# Patient Record
Sex: Male | Born: 1990
Health system: Southern US, Community
[De-identification: ages and names within clinical notes are randomized; demographics above are authoritative.]

## PROBLEM LIST (undated history)

## (undated) DIAGNOSIS — T7840XA Allergy, unspecified, initial encounter: Secondary | ICD-10-CM

## (undated) DIAGNOSIS — G43909 Migraine, unspecified, not intractable, without status migrainosus: Secondary | ICD-10-CM

## (undated) HISTORY — DX: Migraine, unspecified, not intractable, without status migrainosus: G43.909

## (undated) HISTORY — DX: Allergy, unspecified, initial encounter: T78.40XA

---
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
## 2013-03-25 HISTORY — PX: APPENDECTOMY: SHX54

## 2013-08-19 LAB — URINALYSIS, COMPLETE
BILIRUBIN, UR: NEGATIVE
BLOOD: NEGATIVE
Bacteria: NONE SEEN
GLUCOSE, UR: NEGATIVE mg/dL (ref 0–75)
Leukocyte Esterase: NEGATIVE
NITRITE: NEGATIVE
PH: 6 (ref 4.5–8.0)
PROTEIN: NEGATIVE
RBC, UR: NONE SEEN /HPF (ref 0–5)
SQUAMOUS EPITHELIAL: NONE SEEN
Specific Gravity: 1.021 (ref 1.003–1.030)
WBC UR: NONE SEEN /HPF (ref 0–5)

## 2013-08-19 LAB — CBC WITH DIFFERENTIAL/PLATELET
Basophil #: 0.1 10*3/uL (ref 0.0–0.1)
Basophil %: 0.3 %
EOS PCT: 0.7 %
Eosinophil #: 0.1 10*3/uL (ref 0.0–0.7)
HCT: 48.8 % (ref 40.0–52.0)
HGB: 15.8 g/dL (ref 13.0–18.0)
Lymphocyte #: 1.8 10*3/uL (ref 1.0–3.6)
Lymphocyte %: 9.3 %
MCH: 29.2 pg (ref 26.0–34.0)
MCHC: 32.4 g/dL (ref 32.0–36.0)
MCV: 90 fL (ref 80–100)
Monocyte #: 0.8 x10 3/mm (ref 0.2–1.0)
Monocyte %: 4.2 %
Neutrophil #: 16.2 10*3/uL — ABNORMAL HIGH (ref 1.4–6.5)
Neutrophil %: 85.5 %
Platelet: 268 10*3/uL (ref 150–440)
RBC: 5.43 10*6/uL (ref 4.40–5.90)
RDW: 13.6 % (ref 11.5–14.5)
WBC: 19 10*3/uL — ABNORMAL HIGH (ref 3.8–10.6)

## 2013-08-19 LAB — COMPREHENSIVE METABOLIC PANEL
ALK PHOS: 76 U/L
ANION GAP: 9 (ref 7–16)
Albumin: 4.5 g/dL (ref 3.4–5.0)
BUN: 12 mg/dL (ref 7–18)
Bilirubin,Total: 0.5 mg/dL (ref 0.2–1.0)
CREATININE: 1.17 mg/dL (ref 0.60–1.30)
Calcium, Total: 9.6 mg/dL (ref 8.5–10.1)
Chloride: 104 mmol/L (ref 98–107)
Co2: 28 mmol/L (ref 21–32)
EGFR (African American): >60
EGFR (Non-African Amer.): >60
Glucose: 100 mg/dL — ABNORMAL HIGH (ref 65–99)
Osmolality: 281 (ref 275–301)
Potassium: 3.9 mmol/L (ref 3.5–5.1)
SGOT(AST): 14 U/L — ABNORMAL LOW (ref 15–37)
SGPT (ALT): 13 U/L (ref 12–78)
Sodium: 141 mmol/L (ref 136–145)
Total Protein: 8.4 g/dL — ABNORMAL HIGH (ref 6.4–8.2)

## 2013-08-19 LAB — LIPASE, BLOOD: Lipase: 101 U/L (ref 73–393)

## 2013-08-20 ENCOUNTER — Ambulatory Visit: Payer: Self-pay | Admitting: Surgery

## 2013-08-23 LAB — PATHOLOGY REPORT

## 2013-10-09 ENCOUNTER — Ambulatory Visit: Payer: Self-pay | Admitting: Physician Assistant

## 2013-10-09 LAB — MONONUCLEOSIS SCREEN: MONO TEST: NEGATIVE

## 2013-10-09 LAB — RAPID STREP-A WITH REFLX: Micro Text Report: NEGATIVE

## 2013-10-12 LAB — BETA STREP CULTURE(ARMC)

## 2014-07-16 NOTE — H&P (Signed)
PATIENT NAME:  Mark Fox, [name] MR#:  [id number] DATE OF BIRTH:  01-Jul-1990  DATE OF ADMISSION:  08/19/2013  PRIMARY CARE PHYSICIAN: [facility].   ADMITTING PHYSICIAN: Dr. Michela PitcherEly.   CHIEF COMPLAINT: Abdominal pain, nausea, vomiting.   BRIEF HISTORY: Mark EddyZachary Fox is a 24 year old young man with an approximately 12 hour history of significant suprapubic, periumbilical abdominal pain. The pain was accompanied with nausea, vomiting. No fever but a questionable chill. He had no previous similar symptoms. He denies any other significant abdominal history. He has had no previous abdominal surgery. Workup in the Emergency Room revealed an elevated white blood cell count and CT scan demonstrated thickened appendix with evidence consistent with acute appendicitis. There did not appear to be evidence of rupture. The surgical service was consulted.   He denies any major medical problems. He does have history of migraines, currently treated by [facility] headache [facility]. He was originally cared for by [facility].   ADMISSION MEDICATIONS:, prochlorperazine 10 mg 3 times a day and Topamax 3 tablets once a day.   ALLERGIES: He has no medical allergies.   SOCIAL HISTORY: He not a cigarette smoker.   REVIEW OF SYSTEMS: Otherwise unremarkable with the exception of the symptoms noted above.   PHYSICAL EXAMINATION:  GENERAL: He is lying comfortably in bed, in moderate distress from pain.  VITALS: Blood pressure is 120/80. Heart rate is 56 and regular. He is afebrile.  HEENT: No scleral icterus. No pupillary abnormalities. No facial deformities.  NECK: Supple, nontender. No adenopathy. Midline trachea.  CHEST: Clear with no adventitious sounds and normal pulmonary excursion.  CARDIAC: No murmurs or gallops to my ear. He seems to be in normal sinus rhythm.  ABDOMEN: Generally soft. He does have some suprapubic bilateral lower quadrant tenderness. Point tenderness right lower  quadrant with mild guarding and rebound noted.  LOWER EXTREMITIES: Full range of motion. No deformities. Good distal pulses.  PSYCHIATRIC: Normal orientation. Normal affect.   IMPRESSION: I have independently reviewed his CT scan. He does have a thickened appendix along the right pelvic sidewall. He does not have any evidence of rupture. With this clinical situation, this elevated white blood cell count and his CT scan, I would suggest he likely has acute appendicitis. We will recommend surgical intervention. This plan has been discussed with the patient and his mother, and they are in agreement.   ____________________________ Carmie Endalph L. Ely III, MD rle:gb D: 08/19/2013 23:38:32 ET T: 08/20/2013 01:33:43 ET JOB#: [id number]  cc: Quentin Orealph L. Ely III, MD, <Dictator> Quentin OreALPH L ELY MD ELECTRONICALLY SIGNED 08/23/2013 9:51

## 2014-07-16 NOTE — Op Note (Signed)
PATIENT NAME:  Mark Fox, Mark Fox MR#:  [id number] DATE OF BIRTH:  11/02/1990  DATE OF PROCEDURE:  08/20/2013  PREOPERATIVE DIAGNOSIS: Acute appendicitis.   POSTOPERATIVE DIAGNOSIS: Acute appendicitis.   PROCEDURE PERFORMED: Laparoscopic appendectomy.   ANESTHESIA: General.   SURGEON: Quentin Orealph L. Ely, MD    OPERATIVE PROCEDURE: With the patient in the supine position after the induction of appropriate general anesthesia, the patient's abdomen was prepped with ChloraPrep and draped with sterile towels. Fox small supraumbilical incision was made in the standard fashion, carried down bluntly through the subcutaneous tissue. Fox Veress needle was used to cannulate the peritoneal cavity. CO2 was insufflated to appropriate pressure measurements. When approximately 2.5 liters of CO2 were instilled, Fox 5 mm port was inserted in the peritoneal cavity and the peritoneal position was confirmed. CO2 was reinsufflated.  Fox right lateral incision was made and Fox 5 mm port inserted under direct vision.  The cecum appeared to hang low in the pelvis, and examining it, there was Fox large, dilated, thickened, injected appendix though without evidence of rupture. Fox left lower quadrant transverse incision was made and Fox 12 mm port inserted under direct vision. Dissection was carried out through the 2 lower ports. The mesoappendix was identified and Fox window created behind the appendix.  The appendix was amputated at the base using the Endo GIA stapler carrying Fox blue load and then the mesoappendix was divided with Fox single application of the Endo GIA stapler carrying Fox white load. The appendix was captured in Endo Catch apparatus and removed without difficulty. The area was then copiously irrigated with Fox warm saline solution. The abdomen was suctioned. The left lower quadrant incision was closed with Fox figure-of-eight suture of 0 Vicryl using the Suture Passer.  The abdomen was then desufflated and all ports withdrawn without  difficulty.  Midline fascia was closed with figure-of-eight sutures, 0 Vicryl.  The skin was closed with 5-0 nylon.  The area was infiltrated with 0.25% Marcaine for postoperative pain control. Sterile dressings were applied. The patient was returned to the recovery room, having tolerated the procedure well. Sponge, instrument, and needle counts were correct x 2 in the operating room.    ____________________________ Quentin Orealph L. Ely III, MD rle:dd D: 08/20/2013 01:13:32 ET T: 08/20/2013 05:42:32 ET JOB#: [id number]  cc: Quentin Orealph L. Ely III, MD, <Dictator> Quentin OreALPH L ELY MD ELECTRONICALLY SIGNED 08/23/2013 9:51

## 2014-12-27 IMAGING — CT CT ABD-PELV W/ CM
2 of 4 series · 15 of 46 positions shown, 17 images · IV contrast (isovue)
Comparison: None.

CLINICAL DATA: Abdominal pain and vomiting.  Leukocytosis.

EXAM:
CT ABDOMEN AND PELVIS WITH CONTRAST
TECHNIQUE: Multidetector CT imaging of the abdomen and pelvis was performed
using the standard protocol following bolus administration of
intravenous contrast.
CONTRAST:  80 mL of Isovue 300 IV contrast

[Series 2: routine abd pel with · axial · 0.61mm/px · z∈[+434,+784]mm · 12 of 81 slices shown, 14 images]
[im 7/81  soft-tissue]
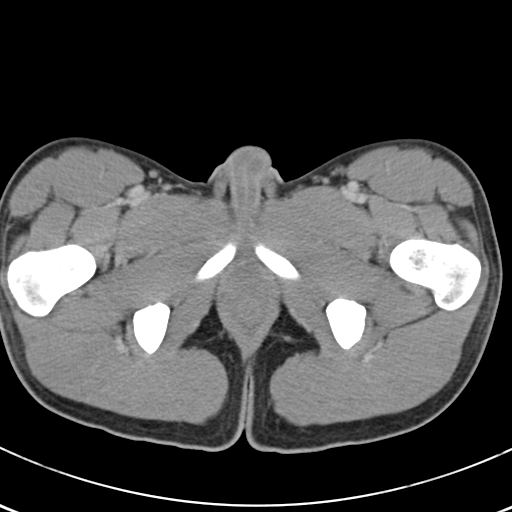
[im 7/81  bone]
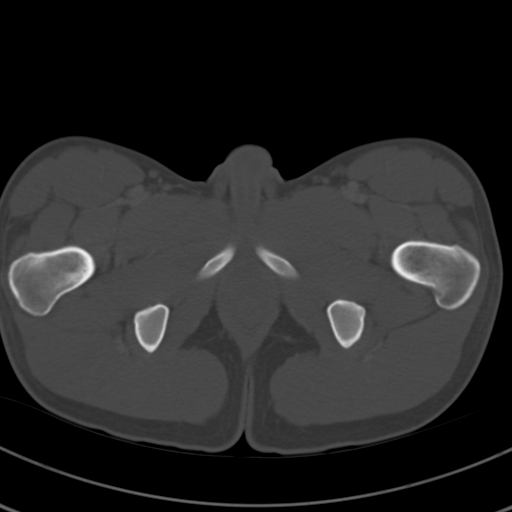
[im 13/81  soft-tissue]
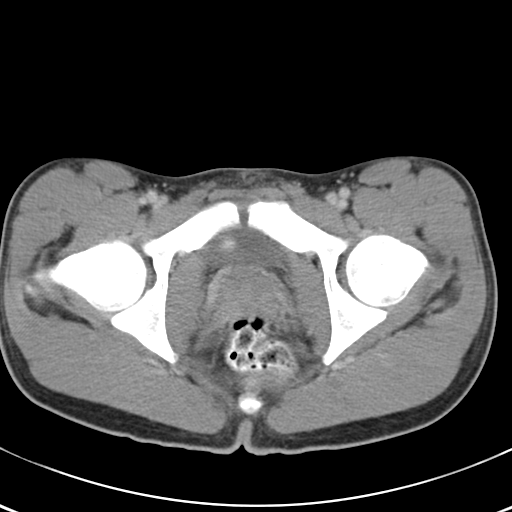
[im 20/81  soft-tissue]
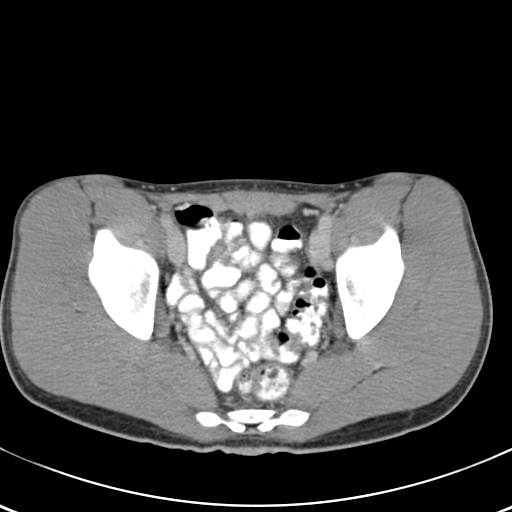
[im 26/81  soft-tissue]
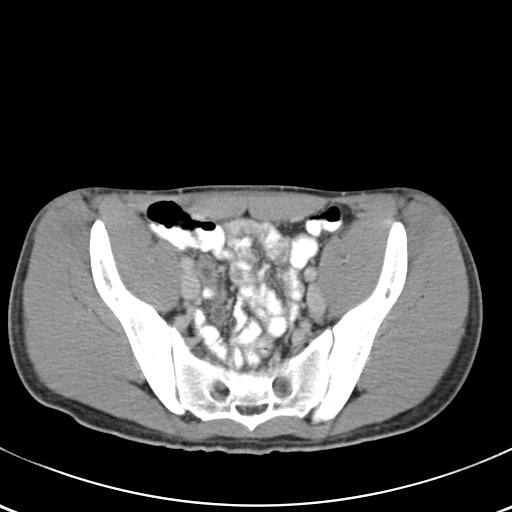
[im 33/81  soft-tissue]
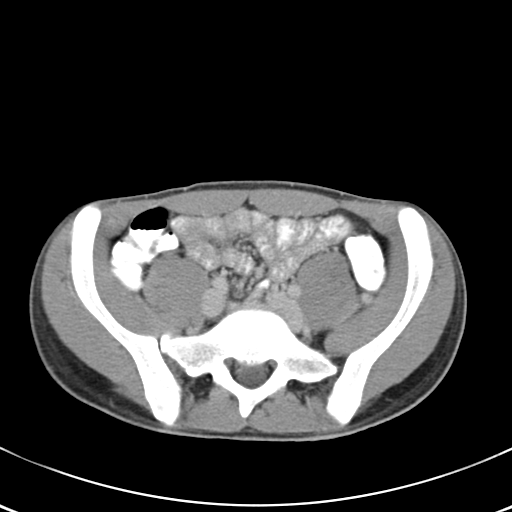
[im 39/81  soft-tissue]
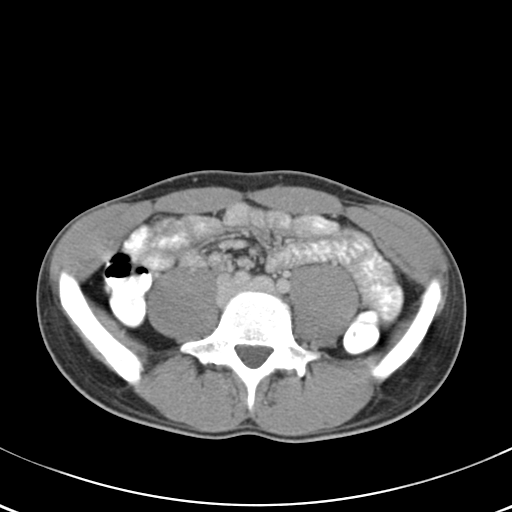
[im 45/81  soft-tissue]
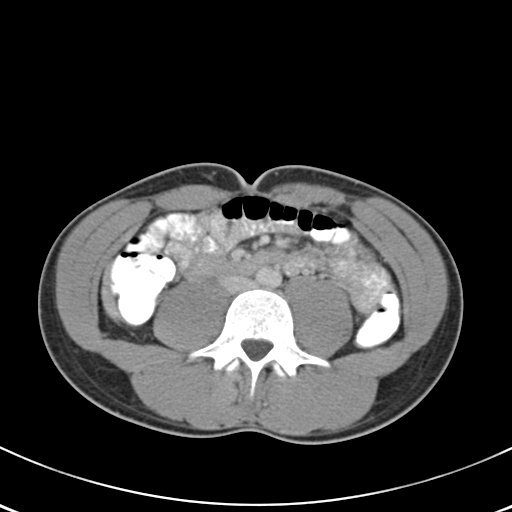
[im 52/81  soft-tissue]
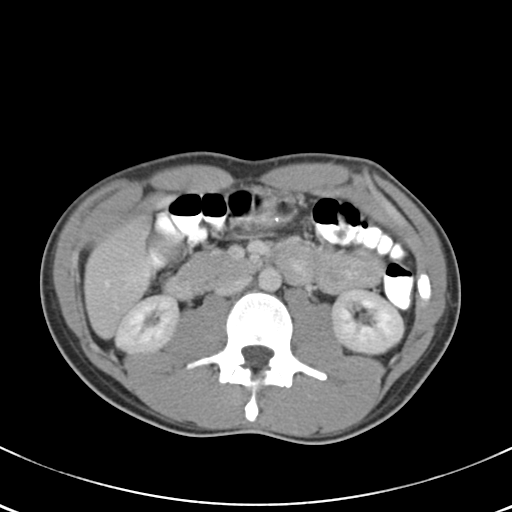
[im 58/81  soft-tissue]
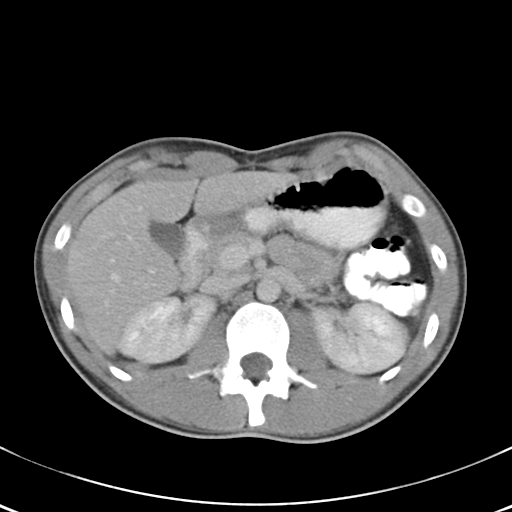
[im 58/81  bone]
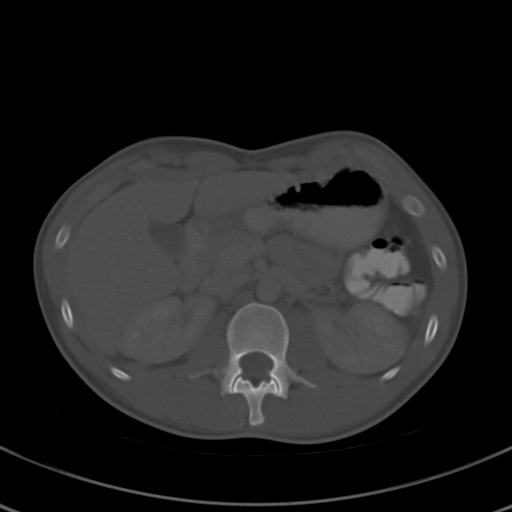
[im 65/81  soft-tissue]
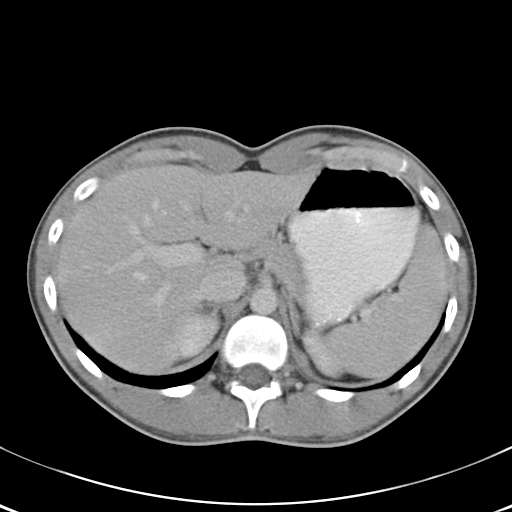
[im 71/81  soft-tissue]
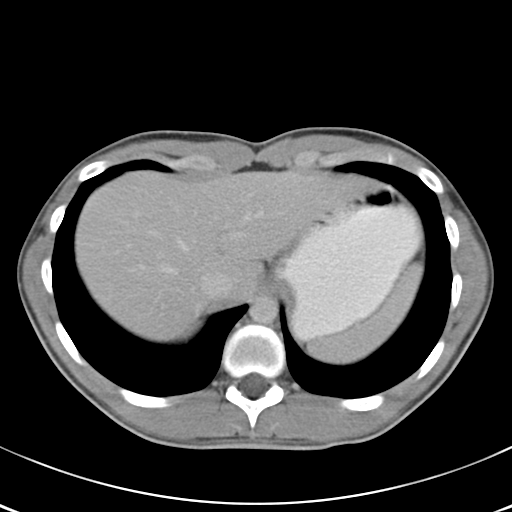
[im 77/81  soft-tissue]
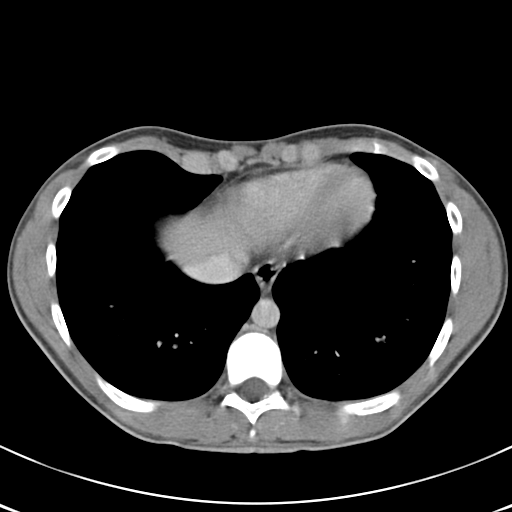

[Series 5: cor routine abd pel with · coronal · 0.53mm/px · 3 of 97 slices shown]
[im 33/97  soft-tissue]
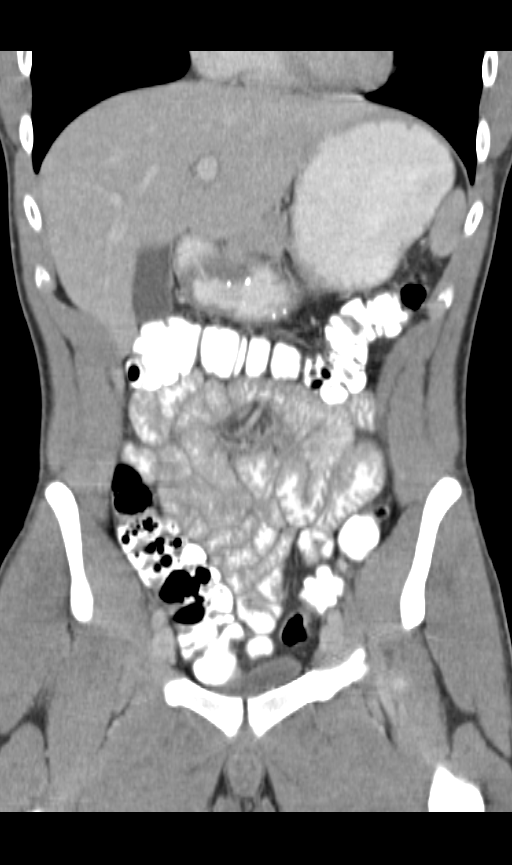
[im 43/97  soft-tissue]
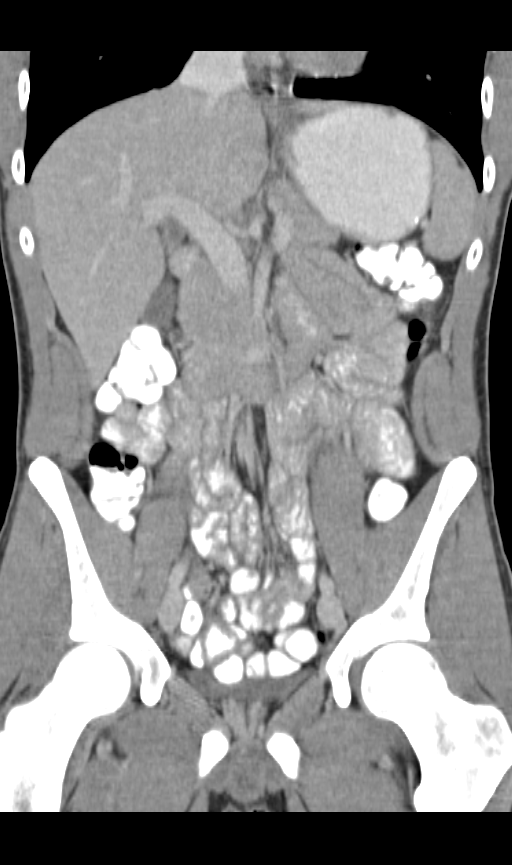
[im 54/97  soft-tissue]
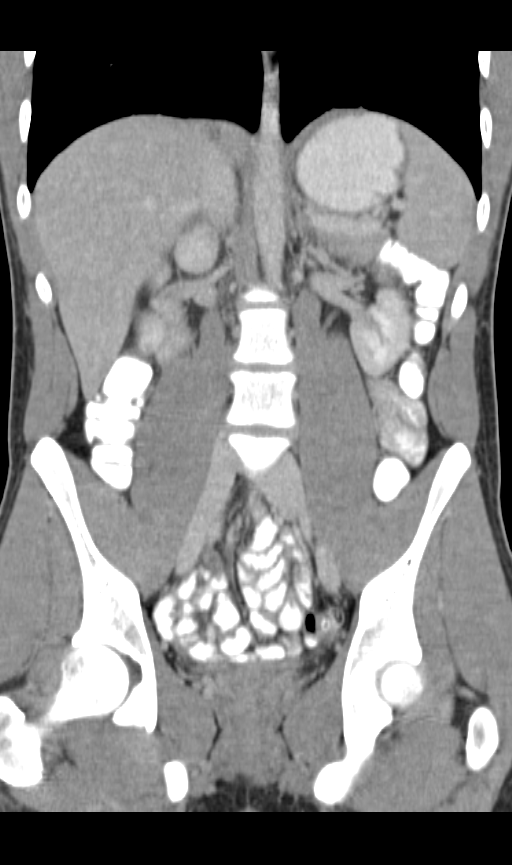

[15 of 46 positions shown; findings below may reference images not displayed]

FINDINGS: The visualized lung bases are clear.

The liver and spleen are unremarkable in appearance. The gallbladder
is within normal limits. The pancreas and adrenal glands are
unremarkable.

The kidneys are unremarkable in appearance. There is no evidence of
hydronephrosis. No renal or ureteral stones are seen. No perinephric
stranding is appreciated.

No free fluid is identified. The small bowel is unremarkable in
appearance. The stomach is within normal limits. No acute vascular
abnormalities are seen.

The appendix is dilated, seen in a retrocecal position extending
along the upper right hemipelvis. It measures up to 1.2 cm in
diameter, with diffuse wall thickening, compatible with mild acute
appendicitis. No definite significant soft tissue inflammation or
free fluid is seen. There is no evidence for perforation or abscess
formation.

The colon is unremarkable in appearance. Contrast progresses to the
rectum.

The bladder is decompressed and not well assessed. The prostate
remains normal in size, but is difficult to fully characterize due
to surrounding vasculature. No inguinal lymphadenopathy is seen.

No acute osseous abnormalities are identified.
IMPRESSION: Mild acute appendicitis, with dilatation of the appendix to 1.2 cm
in maximal diameter, and diffuse wall thickening. No significant
soft tissue inflammation or free fluid seen; no evidence for
perforation or abscess formation. The appendix is retrocecal in
nature, and extends posteriorly along the upper right hemipelvis.

These results were called by telephone at the time of interpretation
on 08/19/2013 at [DATE] to Dr. YABIEL BLY, who verbally
acknowledged these results.

## 2015-08-01 ENCOUNTER — Ambulatory Visit (INDEPENDENT_AMBULATORY_CARE_PROVIDER_SITE_OTHER): Payer: 59 | Admitting: Internal Medicine

## 2015-08-01 ENCOUNTER — Encounter: Payer: Self-pay | Admitting: Internal Medicine

## 2015-08-01 VITALS — BP 110/72 | HR 72 | Resp 16 | Ht 66.0 in | Wt 120.0 lb

## 2015-08-01 DIAGNOSIS — Z720 Tobacco use: Secondary | ICD-10-CM | POA: Diagnosis not present

## 2015-08-01 DIAGNOSIS — G43009 Migraine without aura, not intractable, without status migrainosus: Secondary | ICD-10-CM | POA: Diagnosis not present

## 2015-08-01 DIAGNOSIS — G4700 Insomnia, unspecified: Secondary | ICD-10-CM

## 2015-08-01 DIAGNOSIS — G43909 Migraine, unspecified, not intractable, without status migrainosus: Secondary | ICD-10-CM | POA: Insufficient documentation

## 2015-08-01 MED ORDER — PROPRANOLOL HCL ER 80 MG PO CP24: 80.0000 mg | ORAL_CAPSULE | Freq: Every day | ORAL | Status: DC

## 2015-08-01 MED ORDER — ISOMETHEPTENE-DICHLORAL-APAP 65-100-325 MG PO CAPS: 1.0000 | ORAL_CAPSULE | Freq: Four times a day (QID) | ORAL | Status: DC | PRN

## 2015-08-01 NOTE — Progress Notes (Signed)
Date:  08/01/2015   Name:  Mark Fox   DOB:  10/10/1990   MRN:  [id number]  New patient establising care for migaine headache. Chief Complaint: Establish Care Migraine  This is a chronic problem. Episode onset: age 89. Episode frequency: about every 2 weeks. The pain is located in the bilateral region. Associated symptoms include nausea. Pertinent negatives include no coughing, dizziness, fever, hearing loss, numbness or vomiting. The symptoms are aggravated by emotional stress and weather changes.  Over the years he's taken several different medications. Relpax worked for a while but then when he switched to Midrin which seemed to do a better job. He's been on Inderal LA 80 mg daily for prevention. He's also been on Topamax for prevention. He often takes Benadryl as well if he thinks he may have an allergy triggered migraine. He was seen for a number of years by [facility]y [facility] but has aged out. Over the past 2 years he's been in school at [employer] and has continued to use Midrin as needed but stopped Topamax preventatively about 1 year ago. He has not noticed much change in the frequency of his headaches. 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  He uses about 2-3 Midrin per migraine episode. He says his migraines can be triggered by weather changes, dehydration, lack of sleep, allergies, and stress. He has significant photophobia and phonophobia requiring him to go to a small dark room. He can sometimes stop the headaches if he takes ibuprofen and reclines.   Review of Systems  Constitutional: Negative for fever, chills and fatigue.  HENT: Positive for nosebleeds. Negative for hearing loss.   Eyes: Negative for visual disturbance.  Respiratory: Negative for cough, chest tightness, shortness of breath and wheezing.   Cardiovascular: Negative for chest pain, palpitations and leg swelling.  Gastrointestinal: Positive for  nausea. Negative for vomiting.  Allergic/Immunologic: Positive for environmental allergies.  Neurological: Positive for headaches. Negative for dizziness, tremors, syncope and numbness.  Psychiatric/Behavioral: Positive for sleep disturbance. Negative for dysphoric mood.    Patient Active Problem List   Diagnosis Date Noted  . Headache, migraine 08/01/2015    Prior to Admission medications   Medication Sig Start Date End Date Taking? Authorizing Provider  isometheptene-acetaminophen-dichloralphenazone (MIDRIN) 65-100-325 MG capsule Take 1 capsule by mouth 4 (four) times daily as needed for migraine (Take 2 caps at onset then take 1 each hour up to 5 until symptoms resolve.). Maximum 5 capsules in 12 hours for migraine headaches, 8 capsules in 24 hours for tension headaches.   Yes Historical Provider, MD    No Known Allergies  Past Surgical History  Procedure Laterality Date  . Appendectomy      Social History  Substance Use Topics  . Smoking status: Current Every Day Smoker -- 0.50 packs/day    Types: Cigarettes  . Smokeless tobacco: Never Used  . Alcohol Use: 3.0 oz/week    5 Cans of beer per week     Medication list has been reviewed and updated.   Physical Exam  Constitutional: He is oriented to person, place, and time. He appears well-developed and well-nourished. No distress.  HENT:  Head: Normocephalic and atraumatic.  Neck: Normal range of motion. Neck supple. No thyromegaly present.  Cardiovascular: Normal rate, regular rhythm and normal heart sounds.   Pulmonary/Chest: Effort normal and breath sounds normal. No respiratory distress. He has no wheezes. He has no  rales.  Musculoskeletal: Normal range of motion. He exhibits no edema or tenderness.  Lymphadenopathy:    He has no cervical adenopathy.  Neurological: He is alert and oriented to person, place, and time.  Skin: Skin is warm and dry. No rash noted.  Psychiatric: He has a normal mood and affect. His  behavior is normal. Thought content normal.    BP 110/72 mmHg  Pulse 72  Resp 16  Ht  (1.676 m)  Wt 120 lb (54.432 kg)  BMI 19.38 kg/m2  Assessment and Plan: 1. 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 Continue Midrin PRN - propranolol ER (INDERAL LA) 80 MG 24 hr capsule; Take 1 capsule (80 mg total) by mouth at bedtime.  Dispense: 90 capsule; Refill: 1 - isometheptene-acetaminophen-dichloralphenazone (MIDRIN) 65-100-325 MG capsule; Take 1 capsule by mouth 4 (four) times daily as needed for migraine (Take 2 caps at onset then take 1 each hour up to 5 until symptoms resolve.). Maximum 5 capsules in 12 hours for migraine headaches, 8 capsules in 24 hours for tension headaches.  Dispense: 60 capsule; Refill: 1  2. Insomnia Recommend Melatonin 3 mg at hs Can still use benedryl if needed  3. Tobacco use He is strongly encouraged to quit  Bari Edward, MD [facility]  08/01/2015

## 2015-12-19 ENCOUNTER — Encounter: Payer: Self-pay | Admitting: Internal Medicine

## 2015-12-19 ENCOUNTER — Ambulatory Visit (INDEPENDENT_AMBULATORY_CARE_PROVIDER_SITE_OTHER): Payer: 59 | Admitting: Internal Medicine

## 2015-12-19 VITALS — BP 110/76 | HR 86 | Resp 16 | Ht 66.0 in | Wt 119.0 lb

## 2015-12-19 DIAGNOSIS — G43009 Migraine without aura, not intractable, without status migrainosus: Secondary | ICD-10-CM | POA: Diagnosis not present

## 2015-12-19 DIAGNOSIS — M6248 Contracture of muscle, other site: Secondary | ICD-10-CM

## 2015-12-19 DIAGNOSIS — M62838 Other muscle spasm: Secondary | ICD-10-CM

## 2015-12-19 MED ORDER — CYCLOBENZAPRINE HCL 10 MG PO TABS: 10.0000 mg | ORAL_TABLET | Freq: Two times a day (BID) | ORAL | 1 refills | Status: DC | PRN

## 2015-12-19 NOTE — Progress Notes (Signed)
Date:  12/19/2015   Name:  Mark Fox   DOB:  04-10-1990   MRN:  [id number]   Chief Complaint: Migraine (5/10 on scale for migraine severity Starts in back of neck and getting more often. ) Migraine   This is a chronic problem. The current episode started more than 1 year ago. Associated symptoms include nausea and neck pain. Pertinent negatives include no coughing, dizziness, fever or seizures.  His headache are now being triggered by neck pain and stiffness.  The quality of the HA is the same but does not respond as well to Midrin.  He has seen Chiropractor and plans a few visits. Occurring about 2-3 times per week.  Usually only resolves after sleep.     Review of Systems  Constitutional: Negative for chills, fatigue and fever.  Respiratory: Negative for cough, chest tightness and shortness of breath.   Cardiovascular: Negative for chest pain, palpitations and leg swelling.  Gastrointestinal: Positive for nausea.  Musculoskeletal: Positive for neck pain and neck stiffness.  Skin: Negative for rash.  Neurological: Negative for dizziness, seizures and syncope.    Patient Active Problem List   Diagnosis Date Noted  . Headache, migraine 08/01/2015  . Tobacco use 08/01/2015    Prior to Admission medications   Medication Sig Start Date End Date Taking? Authorizing Provider  isometheptene-acetaminophen-dichloralphenazone (MIDRIN) 65-100-325 MG capsule Take 1 capsule by mouth 4 (four) times daily as needed for migraine (Take 2 caps at onset then take 1 each hour up to 5 until symptoms resolve.). Maximum 5 capsules in 12 hours for migraine headaches, 8 capsules in 24 hours for tension headaches. 08/01/15  Yes Reubin MilanLaura H Neetu Carrozza, MD  propranolol ER (INDERAL LA) 80 MG 24 hr capsule Take 1 capsule (80 mg total) by mouth at bedtime. 08/01/15  Yes Reubin MilanLaura H Nafis Farnan, MD    No Known Allergies  Past Surgical History:  Procedure Laterality Date  . APPENDECTOMY      Social History    Substance Use Topics  . Smoking status: Current Every Day Smoker    Packs/day: 0.50    Types: Cigarettes  . Smokeless tobacco: Never Used  . Alcohol use 3.0 oz/week    5 Cans of beer per week     Medication list has been reviewed and updated.   Physical Exam  Constitutional: He is oriented to person, place, and time. He appears well-developed. No distress.  HENT:  Head: Normocephalic and atraumatic.  Cardiovascular: Normal rate, regular rhythm and normal heart sounds.   Pulmonary/Chest: Effort normal and breath sounds normal. No respiratory distress.  Musculoskeletal:       Right shoulder: He exhibits decreased range of motion and tenderness. He exhibits no effusion and no crepitus.  Neurological: He is alert and oriented to person, place, and time. He has normal strength. No sensory deficit.  Reflex Scores:      Bicep reflexes are 2+ on the right side and 2+ on the left side.      Patellar reflexes are 3+ on the right side and 3+ on the left side. Skin: Skin is warm and dry. No rash noted.  Psychiatric: He has a normal mood and affect. His behavior is normal. Thought content normal.  Nursing note and vitals reviewed.   BP 110/76   Pulse 86   Resp 16   Ht 5\' 6"  (1.676 m)   Wt 119 lb (54 kg)   SpO2 99%   BMI 19.21 kg/m   Assessment and  Plan: 1. Migraine without aura and without status migrainosus, not intractable Continue inderal and midrin  2. Neck muscle spasm Use flexeril at hs and bid as needed along with advil and heat to reduce spasm and progression to migraine - cyclobenzaprine (FLEXERIL) 10 MG tablet; Take 1 tablet (10 mg total) by mouth 2 (two) times daily as needed for muscle spasms.  Dispense: 60 tablet; Refill: 1   Mark Edward, MD [facility]  12/19/2015

## 2016-01-03 ENCOUNTER — Telehealth: Payer: Self-pay

## 2016-01-03 ENCOUNTER — Encounter: Payer: Self-pay | Admitting: Emergency Medicine

## 2016-01-03 DIAGNOSIS — Z79899 Other long term (current) drug therapy: Secondary | ICD-10-CM | POA: Insufficient documentation

## 2016-01-03 DIAGNOSIS — F17210 Nicotine dependence, cigarettes, uncomplicated: Secondary | ICD-10-CM | POA: Insufficient documentation

## 2016-01-03 DIAGNOSIS — G43909 Migraine, unspecified, not intractable, without status migrainosus: Secondary | ICD-10-CM | POA: Insufficient documentation

## 2016-01-03 DIAGNOSIS — Z5321 Procedure and treatment not carried out due to patient leaving prior to being seen by health care provider: Secondary | ICD-10-CM | POA: Insufficient documentation

## 2016-01-03 NOTE — Telephone Encounter (Signed)
Mother LM stating he was in [place] [facility] Migraine yesterday. These have gotten worse and she feels he needs Neuro Ref and asks that we ONLY send [facility] where [facility].  Advising to call insurance and call back with [facility] of [facility].

## 2016-01-03 NOTE — Telephone Encounter (Signed)
Patient was [facility].

## 2016-01-03 NOTE — ED Triage Notes (Signed)
Pt in with co migraine hx of the same, states x 1 week has had them daily. States has seen neurologist for the same in the past. Co nausea and photosensitivity.

## 2016-01-04 ENCOUNTER — Emergency Department
Admission: EM | Admit: 2016-01-04 | Discharge: 2016-01-04 | Disposition: A | Discharge status: Home / Self Care | Payer: Self-pay | Attending: Emergency Medicine | Admitting: Emergency Medicine

## 2016-01-08 ENCOUNTER — Ambulatory Visit (INDEPENDENT_AMBULATORY_CARE_PROVIDER_SITE_OTHER): Payer: 59 | Admitting: Internal Medicine

## 2016-01-08 ENCOUNTER — Encounter: Payer: Self-pay | Admitting: Internal Medicine

## 2016-01-08 VITALS — BP 118/78 | HR 72 | Resp 16 | Ht 65.0 in | Wt 125.0 lb

## 2016-01-08 DIAGNOSIS — G43009 Migraine without aura, not intractable, without status migrainosus: Secondary | ICD-10-CM

## 2016-01-08 MED ORDER — TOPIRAMATE 25 MG PO CPSP: 100.0000 mg | ORAL_CAPSULE | Freq: Every day | ORAL | 2 refills | Status: DC

## 2016-01-08 MED ORDER — ELETRIPTAN HYDROBROMIDE 40 MG PO TABS: 40.0000 mg | ORAL_TABLET | ORAL | 0 refills | Status: DC | PRN

## 2016-01-08 NOTE — Patient Instructions (Signed)
Topamax 25 mg at bedtime for three days then increase by 25 mg every 3 days to a maximum of 100 mg at bedtime.

## 2016-01-08 NOTE — Progress Notes (Signed)
Date:  01/08/2016   Name:  Mark Fox   DOB:  [birth year]   MRN:  [id number]   Chief Complaint: Migraine (Every day over past week or two ) For the past 2 weeks having a migraine every day.  These start in the posterior neck and radiate up to the right temporal area.  He has light sensitivity and nausea, had emesis a few times.  Sleep generally resolves the HA but then they recur.  Current therapy does not seem to be working as well. UC gave him Imitrex inj and toradol - at the time his headache had almost resolved so he does not think they really helped. He continues on Inderal daily and Midrin. He continue to smoke.   Review of Systems  Constitutional: Negative for chills, fatigue, fever and unexpected weight change.  Eyes: Negative for visual disturbance.  Respiratory: Negative for chest tightness and shortness of breath.   Cardiovascular: Negative for chest pain.  Gastrointestinal: Negative for abdominal pain.  Musculoskeletal: Negative for arthralgias and myalgias.  Hematological: Negative for adenopathy.  Psychiatric/Behavioral: Negative for dysphoric mood and sleep disturbance.    Patient Active Problem List   Diagnosis Date Noted  . Migraine without aura and without status migrainosus, not intractable 12/19/2015  . Tobacco use 08/01/2015    Prior to Admission medications   Medication Sig Start Date End Date Taking? Authorizing Provider  cyclobenzaprine (FLEXERIL) 10 MG tablet Take 1 tablet (10 mg total) by mouth 2 (two) times daily as needed for muscle spasms. 12/19/15  Yes Reubin MilanLaura H Twilia Yaklin, MD  isometheptene-acetaminophen-dichloralphenazone (MIDRIN) [id number] MG capsule Take 1 capsule by mouth 4 (four) times daily as needed for migraine (Take 2 caps at onset then take 1 each hour up to 5 until symptoms resolve.). Maximum 5 capsules in 12 hours for migraine headaches, 8 capsules in 24 hours for tension headaches. 08/01/15  Yes Reubin MilanLaura H Raye Wiens, MD  propranolol ER  (INDERAL LA) 80 MG 24 hr capsule Take 1 capsule (80 mg total) by mouth at bedtime. 08/01/15  Yes Reubin MilanLaura H Vearl Allbaugh, MD    No Known Allergies  Past Surgical History:  Procedure Laterality Date  . APPENDECTOMY      Social History  Substance Use Topics  . Smoking status: Current Every Day Smoker    Packs/day: 0.50    Types: Cigarettes  . Smokeless tobacco: Never Used  . Alcohol use 3.0 oz/week    5 Cans of beer per week     Medication list has been reviewed and updated.   Physical Exam  Constitutional: He is oriented to person, place, and time. He appears well-developed. No distress.  HENT:  Head: Normocephalic and atraumatic.  Cardiovascular: Normal rate, regular rhythm and normal heart sounds.   Pulmonary/Chest: Effort normal and breath sounds normal. No respiratory distress.  Musculoskeletal: Normal range of motion.  Neurological: He is alert and oriented to person, place, and time. Gait normal.  Skin: Skin is warm and dry. No rash noted.  Psychiatric: He has a normal mood and affect. His speech is normal and behavior is normal. Thought content normal.  Nursing note and vitals reviewed.   BP 118/78   Pulse 72   Resp 16   Ht 5\' 5"  (1.651 m)   Wt 125 lb (56.7 kg)   SpO2 99%   BMI 20.80 kg/m   Assessment and Plan: 1. Migraine without aura and without status migrainosus, not intractable Continue Inderal Add Topamax Trial of Relpax - worked in  the past Go ER if headache is severe and persistent Encouraged smoking cessation - Ambulatory referral to Neurology - topiramate (TOPAMAX) 25 MG capsule; Take 4 capsules (100 mg total) by mouth at bedtime.  Dispense: 120 capsule; Refill: 2 - eletriptan (RELPAX) 40 MG tablet; Take 1 tablet (40 mg total) by mouth as needed for migraine or headache. May repeat in 2 hours if headache persists or recurs.  Dispense: 10 tablet; Refill: 0   Bari Edward, MD [facility]  01/08/2016

## 2016-02-05 ENCOUNTER — Other Ambulatory Visit: Payer: Self-pay | Admitting: Neurology

## 2016-02-05 DIAGNOSIS — G43011 Migraine without aura, intractable, with status migrainosus: Secondary | ICD-10-CM

## 2016-04-01 ENCOUNTER — Other Ambulatory Visit: Payer: Self-pay | Admitting: Internal Medicine

## 2016-04-01 DIAGNOSIS — G43009 Migraine without aura, not intractable, without status migrainosus: Secondary | ICD-10-CM

## 2016-06-07 ENCOUNTER — Telehealth: Payer: Self-pay

## 2016-06-07 DIAGNOSIS — M62838 Other muscle spasm: Secondary | ICD-10-CM

## 2016-06-07 DIAGNOSIS — G43009 Migraine without aura, not intractable, without status migrainosus: Secondary | ICD-10-CM

## 2016-06-07 MED ORDER — CYCLOBENZAPRINE HCL 10 MG PO TABS: 10.0000 mg | ORAL_TABLET | Freq: Two times a day (BID) | ORAL | 1 refills | Status: DC | PRN

## 2016-06-07 MED ORDER — TOPIRAMATE 25 MG PO CPSP: 100.0000 mg | ORAL_CAPSULE | Freq: Every day | ORAL | 1 refills | Status: DC

## 2016-06-07 MED ORDER — ELETRIPTAN HYDROBROMIDE 40 MG PO TABS: ORAL_TABLET | ORAL | 1 refills | Status: DC

## 2016-06-17 NOTE — Telephone Encounter (Signed)
error 

## 2016-07-12 ENCOUNTER — Encounter: Payer: Self-pay | Admitting: Internal Medicine

## 2016-07-12 ENCOUNTER — Ambulatory Visit (INDEPENDENT_AMBULATORY_CARE_PROVIDER_SITE_OTHER): Payer: BLUE CROSS/BLUE SHIELD | Admitting: Internal Medicine

## 2016-07-12 VITALS — BP 102/64 | HR 90 | Ht 65.0 in | Wt 123.6 lb

## 2016-07-12 DIAGNOSIS — R300 Dysuria: Secondary | ICD-10-CM | POA: Diagnosis not present

## 2016-07-12 DIAGNOSIS — G43009 Migraine without aura, not intractable, without status migrainosus: Secondary | ICD-10-CM

## 2016-07-12 LAB — POC URINALYSIS WITH MICROSCOPIC (NON AUTO)MANUAL RESULT
BACTERIA UA: 0
Bilirubin, UA: NEGATIVE
Blood, UA: NEGATIVE
Crystals: 0
Epithelial cells, urine per micros: 0
Glucose, UA: NEGATIVE
Ketones, UA: NEGATIVE
Nitrite, UA: NEGATIVE
RBC: 0 M/uL — AB (ref 4.69–6.13)
Spec Grav, UA: <=1.005 — AB (ref 1.010–1.025)
Urobilinogen, UA: 0.2 E.U./dL
WBC Casts, UA: 0
pH, UA: 7.5 (ref 5.0–8.0)

## 2016-07-12 MED ORDER — ELETRIPTAN HYDROBROMIDE 40 MG PO TABS: ORAL_TABLET | ORAL | 5 refills | Status: DC

## 2016-07-12 MED ORDER — DOXYCYCLINE HYCLATE 100 MG PO TABS: 100.0000 mg | ORAL_TABLET | Freq: Two times a day (BID) | ORAL | 0 refills | Status: AC

## 2016-07-12 MED ORDER — TOPIRAMATE 50 MG PO TABS: 50.0000 mg | ORAL_TABLET | Freq: Every day | ORAL | 5 refills | Status: DC

## 2016-07-12 NOTE — Progress Notes (Signed)
Date:  07/12/2016   Name:  Mark Fox   DOB:  05-22-1990   MRN:  [id number]   Chief Complaint: Migraine (Med refill) and Insomnia Migraine   This is a recurrent problem. Episode frequency: about once a week now. The problem has been unchanged. The pain quality is similar to prior headaches. The pain is moderate. Pertinent negatives include no fever or vomiting. The symptoms are aggravated by weather changes and emotional stress. He has tried triptans for the symptoms. The treatment provided significant relief. His past medical history is significant for migraine headaches.  Dysuria   This is a recurrent problem. The problem occurs intermittently. The problem has been unchanged. The quality of the pain is described as burning. The pain is mild. There has been no fever. Associated symptoms include frequency, hesitancy and urgency. Pertinent negatives include no chills, hematuria or vomiting. He has tried nothing for the symptoms.     Review of Systems  Constitutional: Negative for chills, diaphoresis and fever.  Respiratory: Negative for chest tightness, shortness of breath and wheezing.   Gastrointestinal: Negative for vomiting.  Genitourinary: Positive for dysuria, frequency, hesitancy and urgency. Negative for discharge, hematuria and penile pain.  Musculoskeletal: Negative for arthralgias.    Patient Active Problem List   Diagnosis Date Noted  . Migraine without aura and without status migrainosus, not intractable 12/19/2015  . Tobacco use 08/01/2015    Prior to Admission medications   Medication Sig Start Date End Date Taking? Authorizing Provider  cyclobenzaprine (FLEXERIL) 10 MG tablet Take 1 tablet (10 mg total) by mouth 2 (two) times daily as needed for muscle spasms. 06/07/16  Yes Reubin Milan, MD  eletriptan (RELPAX) 40 MG tablet TAKE 1 TABLET BY MOUTH AS NEEDED FOR MIGRAINE HEADACHE. MAY REPEAT IN 2 HOURS IF HEADACHE PERSISTS OR RECURS. 06/07/16  Yes Reubin Milan, MD      Yes Reubin Milan, MD      Yes Reubin Milan, MD  topiramate (TOPAMAX) 50 MG tablet Take 1 tablet by mouth at bedtime. 02/01/16  Yes Historical Provider, MD    No Known Allergies  Past Surgical History:  Procedure Laterality Date  . APPENDECTOMY      Social History  Substance Use Topics  . Smoking status: Current Every Day Smoker    Packs/day: 0.50    Types: Cigarettes  . Smokeless tobacco: Never Used  . Alcohol use 3.0 oz/week    5 Cans of beer per week     Medication list has been reviewed and updated.   Physical Exam  Constitutional: He is oriented to person, place, and time. He appears well-developed. No distress.  HENT:  Head: Normocephalic and atraumatic.  Cardiovascular: Normal rate, regular rhythm and normal heart sounds.   Pulmonary/Chest: Effort normal and breath sounds normal. No respiratory distress. He has no wheezes.  Abdominal: Soft. Bowel sounds are normal. He exhibits no mass. There is no tenderness.  Musculoskeletal: Normal range of motion. He exhibits no edema.  Neurological: He is alert and oriented to person, place, and time.  Skin: Skin is warm and dry. No rash noted.  Psychiatric: He has a normal mood and affect. His behavior is normal. Thought content normal.  Nursing note and vitals reviewed.   BP 102/64 (BP Location: Right Arm, Patient Position: Sitting, Cuff Size: Normal)   Pulse 90   Ht  (1.651 m)   Wt 123 lb 9.6 oz (56.1 kg)   SpO2 99%  BMI 20.57 kg/m   Assessment and Plan: 1. Migraine without aura and without status migrainosus, not intractable Continue topamax and relpax - eletriptan (RELPAX) 40 MG tablet; TAKE 1 TABLET BY MOUTH AS NEEDED FOR MIGRAINE HEADACHE. MAY REPEAT IN 2 HOURS IF HEADACHE PERSISTS OR RECURS.  Dispense: 12 tablet; Refill: 5  2. Dysuria Treat for possible NGU - POC urinalysis w microscopic (non auto)   Meds ordered this encounter  Medications  . topiramate (TOPAMAX) 50 MG  tablet    Sig: Take 1 tablet (50 mg total) by mouth at bedtime.    Dispense:  30 tablet    Refill:  5  . eletriptan (RELPAX) 40 MG tablet    Sig: TAKE 1 TABLET BY MOUTH AS NEEDED FOR MIGRAINE HEADACHE. MAY REPEAT IN 2 HOURS IF HEADACHE PERSISTS OR RECURS.    Dispense:  12 tablet    Refill:  5  . doxycycline (VIBRA-TABS) 100 MG tablet    Sig: Take 1 tablet (100 mg total) by mouth 2 (two) times daily.    Dispense:  14 tablet    Refill:  0    Bari Edward, MD [facility]  07/12/2016

## 2016-10-10 ENCOUNTER — Other Ambulatory Visit: Payer: Self-pay | Admitting: Internal Medicine

## 2016-10-10 DIAGNOSIS — M62838 Other muscle spasm: Secondary | ICD-10-CM

## 2016-11-18 ENCOUNTER — Telehealth: Payer: BLUE CROSS/BLUE SHIELD | Admitting: Family

## 2016-11-18 DIAGNOSIS — R399 Unspecified symptoms and signs involving the genitourinary system: Secondary | ICD-10-CM

## 2016-11-18 MED ORDER — NITROFURANTOIN MONOHYD MACRO 100 MG PO CAPS: 100.0000 mg | ORAL_CAPSULE | Freq: Two times a day (BID) | ORAL | 0 refills | Status: DC

## 2016-11-18 NOTE — Progress Notes (Signed)

## 2016-12-13 ENCOUNTER — Other Ambulatory Visit: Payer: Self-pay | Admitting: Internal Medicine

## 2016-12-13 DIAGNOSIS — M62838 Other muscle spasm: Secondary | ICD-10-CM

## 2017-02-21 ENCOUNTER — Other Ambulatory Visit: Payer: Self-pay | Admitting: Internal Medicine

## 2017-02-21 DIAGNOSIS — M62838 Other muscle spasm: Secondary | ICD-10-CM

## 2017-03-14 ENCOUNTER — Other Ambulatory Visit: Payer: Self-pay | Admitting: Internal Medicine

## 2017-03-14 DIAGNOSIS — G43009 Migraine without aura, not intractable, without status migrainosus: Secondary | ICD-10-CM

## 2017-05-02 ENCOUNTER — Other Ambulatory Visit: Payer: Self-pay | Admitting: Internal Medicine

## 2017-05-02 DIAGNOSIS — M62838 Other muscle spasm: Secondary | ICD-10-CM

## 2017-05-02 DIAGNOSIS — G43009 Migraine without aura, not intractable, without status migrainosus: Secondary | ICD-10-CM

## 2017-08-22 ENCOUNTER — Other Ambulatory Visit: Payer: Self-pay | Admitting: Internal Medicine

## 2017-08-22 DIAGNOSIS — M62838 Other muscle spasm: Secondary | ICD-10-CM

## 2017-08-22 DIAGNOSIS — G43009 Migraine without aura, not intractable, without status migrainosus: Secondary | ICD-10-CM

## 2017-08-25 ENCOUNTER — Telehealth: Payer: Self-pay

## 2017-08-25 NOTE — Telephone Encounter (Signed)
LMOM stating the rx was sent into pharmacy on 08/22/2017. Per Dr. Judithann GravesBerglund he either needs to f/u with her or have Neurologist manage medication.

## 2017-08-25 NOTE — Telephone Encounter (Signed)
Spoke with patient who wanted a refill on his Relpax. Patient is seeing a neurologist, Dr. Malvin JohnsPotter, for the last several months. Patient hasn't seen Dr. Judithann GravesBerglund in 1 year. Advise patient that he needs to have Neurologist fill Rx since he is needing it before he leaves on Thursday  since he is being managed by them. Patient understood and will contact neurologist.

## 2017-10-03 ENCOUNTER — Ambulatory Visit: Payer: BLUE CROSS/BLUE SHIELD | Admitting: Internal Medicine

## 2017-10-03 ENCOUNTER — Encounter: Payer: Self-pay | Admitting: Internal Medicine

## 2017-10-03 VITALS — BP 110/68 | HR 66 | Resp 16 | Ht 66.0 in | Wt 142.0 lb

## 2017-10-03 DIAGNOSIS — G43009 Migraine without aura, not intractable, without status migrainosus: Secondary | ICD-10-CM

## 2017-10-03 DIAGNOSIS — K219 Gastro-esophageal reflux disease without esophagitis: Secondary | ICD-10-CM | POA: Diagnosis not present

## 2017-10-03 DIAGNOSIS — M62838 Other muscle spasm: Secondary | ICD-10-CM | POA: Diagnosis not present

## 2017-10-03 MED ORDER — CYCLOBENZAPRINE HCL 10 MG PO TABS: 10.0000 mg | ORAL_TABLET | Freq: Three times a day (TID) | ORAL | 5 refills | Status: DC | PRN

## 2017-10-03 MED ORDER — ELETRIPTAN HYDROBROMIDE 40 MG PO TABS: ORAL_TABLET | ORAL | 5 refills | Status: DC

## 2017-10-03 MED ORDER — RANITIDINE HCL 150 MG PO TABS: 150.0000 mg | ORAL_TABLET | Freq: Two times a day (BID) | ORAL | 5 refills | Status: DC | PRN

## 2017-10-03 NOTE — Progress Notes (Signed)
Date:  10/03/2017   Name:  Mark Fox   DOB:  11-24-1990   MRN:  [id number]   Chief Complaint: Migraine and Gastroesophageal Reflux Migraine   This is a recurrent problem. Episode frequency: 10-12 per month. The problem has been unchanged. The pain quality is similar to prior headaches. Associated symptoms include back pain. Pertinent negatives include no abdominal pain, dizziness or fever. He has tried NSAIDs, triptans and antidepressants for the symptoms. The treatment provided moderate relief.  Gastroesophageal Reflux  He complains of heartburn. He reports no abdominal pain, no chest pain or no wheezing. This is a new problem. The current episode started more than 1 month ago. The problem occurs occasionally. The problem has been waxing and waning. The heartburn duration is several minutes. The heartburn is located in the substernum. The heartburn is of moderate intensity. Pertinent negatives include no fatigue. He has tried nothing for the symptoms.  Back Pain  This is a recurrent problem. The problem occurs intermittently. The problem is unchanged. The pain is present in the lumbar spine. The pain does not radiate. The pain is mild. Associated symptoms include headaches. Pertinent negatives include no abdominal pain, chest pain or fever. He has tried muscle relaxant for the symptoms. The treatment provided significant relief.   He is seeing Dr. Malvin JohnsPotter, recently started on Nortryptiline at bedtime.  He is not sure if it is helping much.   Review of Systems  Constitutional: Negative for chills, fatigue and fever.  Respiratory: Negative for chest tightness, shortness of breath and wheezing.   Cardiovascular: Negative for chest pain.  Gastrointestinal: Positive for heartburn. Negative for abdominal pain.       Heartburn  Musculoskeletal: Positive for back pain.  Neurological: Positive for headaches. Negative for dizziness.  Psychiatric/Behavioral: Negative for behavioral  problems and sleep disturbance.    Patient Active Problem List   Diagnosis Date Noted  . Migraine without aura and without status migrainosus, not intractable 12/19/2015  . Tobacco use 08/01/2015    Prior to Admission medications   Medication Sig Start Date End Date Taking? Authorizing Provider  cyclobenzaprine (FLEXERIL) 10 MG tablet TAKE 1 TABLET(10 MG) BY MOUTH TWICE DAILY AS NEEDED FOR MUSCLE SPASMS 08/22/17  Yes Reubin MilanBerglund, Lisseth Brazeau H, MD  eletriptan (RELPAX) 40 MG tablet TAKE 1 TABLET BY MOUTH AS NEEDED FOR MIGRAINE HEADACHE, MAY REPEAT IN 2 HOURS IF HEADACHE PERSISTS OR RECURS 08/22/17  Yes Reubin MilanBerglund, Noorah Giammona H, MD  nortriptyline (PAMELOR) 10 MG capsule TK 3 CS PO ATN FOR 1 WEEK. INCREASE TO 4 T ATN 08/07/17  Yes [provider]    No Known Allergies  Past Surgical History:  Procedure Laterality Date  . APPENDECTOMY      Social History   Tobacco Use  . Smoking status: Current Every Day Smoker    Packs/day: 0.50    Types: Cigarettes  . Smokeless tobacco: Never Used  Substance Use Topics  . Alcohol use: Yes    Alcohol/week: 3.0 oz    Types: 5 Cans of beer per week  . Drug use: No     Medication list has been reviewed and updated.  Current Meds  Medication Sig  . cyclobenzaprine (FLEXERIL) 10 MG tablet Take 1 tablet (10 mg total) by mouth 3 (three) times daily as needed for muscle spasms.  Marland Kitchen. eletriptan (RELPAX) 40 MG tablet TAKE 1 TABLET BY MOUTH AS NEEDED FOR MIGRAINE HEADACHE, MAY REPEAT IN 2 HOURS IF HEADACHE PERSISTS OR RECURS  . nortriptyline (PAMELOR)  10 MG capsule TK 3 CS PO ATN FOR 1 WEEK. INCREASE TO 4 T ATN  . [DISCONTINUED] cyclobenzaprine (FLEXERIL) 10 MG tablet TAKE 1 TABLET(10 MG) BY MOUTH TWICE DAILY AS NEEDED FOR MUSCLE SPASMS  . [DISCONTINUED] eletriptan (RELPAX) 40 MG tablet TAKE 1 TABLET BY MOUTH AS NEEDED FOR MIGRAINE HEADACHE, MAY REPEAT IN 2 HOURS IF HEADACHE PERSISTS OR RECURS    PHQ 2/9 Scores 10/03/2017 08/01/2015  PHQ - 2 Score 1 0     Physical Exam  Constitutional: He is oriented to person, place, and time. He appears well-developed. No distress.  HENT:  Head: Normocephalic and atraumatic.  Neck: Normal range of motion. Neck supple.  Cardiovascular: Normal rate, regular rhythm and normal heart sounds.  Pulmonary/Chest: Effort normal and breath sounds normal. No respiratory distress.  Abdominal: Soft. Bowel sounds are normal. He exhibits no distension and no mass. There is no tenderness. There is no rebound and no guarding.  Musculoskeletal: Normal range of motion.  Neurological: He is alert and oriented to person, place, and time.  Skin: Skin is warm and dry. No rash noted.  Psychiatric: He has a normal mood and affect. His behavior is normal. Thought content normal.  Nursing note and vitals reviewed.   BP 110/68   Pulse 66   Resp 16   Ht 5\' 6"  (1.676 m)   Wt 142 lb (64.4 kg)   SpO2 98%   BMI 22.92 kg/m   Assessment and Plan: 1. Neck muscle spasm - cyclobenzaprine (FLEXERIL) 10 MG tablet; Take 1 tablet (10 mg total) by mouth 3 (three) times daily as needed for muscle spasms.  Dispense: 90 tablet; Refill: 5  2. Migraine without aura and without status migrainosus, not intractable Continue daily preventative and follow up with neurology - eletriptan (RELPAX) 40 MG tablet; TAKE 1 TABLET BY MOUTH AS NEEDED FOR MIGRAINE HEADACHE, MAY REPEAT IN 2 HOURS IF HEADACHE PERSISTS OR RECURS  Dispense: 12 tablet; Refill: 5  3. Gastroesophageal reflux disease, esophagitis presence not specified Begin zantac bid PRN only - ranitidine (ZANTAC) 150 MG tablet; Take 1 tablet (150 mg total) by mouth 2 (two) times daily as needed for heartburn.  Dispense: 60 tablet; Refill: 5   Meds ordered this encounter  Medications  . cyclobenzaprine (FLEXERIL) 10 MG tablet    Sig: Take 1 tablet (10 mg total) by mouth 3 (three) times daily as needed for muscle spasms.    Dispense:  90 tablet    Refill:  5  . ranitidine (ZANTAC) 150 MG  tablet    Sig: Take 1 tablet (150 mg total) by mouth 2 (two) times daily as needed for heartburn.    Dispense:  60 tablet    Refill:  5  . eletriptan (RELPAX) 40 MG tablet    Sig: TAKE 1 TABLET BY MOUTH AS NEEDED FOR MIGRAINE HEADACHE, MAY REPEAT IN 2 HOURS IF HEADACHE PERSISTS OR RECURS    Dispense:  12 tablet    Refill:  5    Partially dictated using Animal nutritionist. Any errors are unintentional.  Bari Edward, MD [facility]  10/03/2017   There are no diagnoses linked to this encounter.

## 2018-05-04 ENCOUNTER — Other Ambulatory Visit: Payer: Self-pay | Admitting: Internal Medicine

## 2018-05-04 DIAGNOSIS — M62838 Other muscle spasm: Secondary | ICD-10-CM

## 2018-05-04 DIAGNOSIS — K219 Gastro-esophageal reflux disease without esophagitis: Secondary | ICD-10-CM

## 2018-06-03 ENCOUNTER — Telehealth: Payer: Self-pay

## 2018-06-03 NOTE — Telephone Encounter (Signed)
Patient called stating he needs a PA for his eletriptan medication. He has new insurance this year. Called pharmacy and confirmed. Spoke with pt and informed I do not have his current insurance card so he will need to FAX it or bring it in. He said he will try to fax it but if it doesn't come in he will bring it in. Told him lunch times and times he can come.  He verbalized understanding. Told him I will do PA when insurance card comes in,.

## 2018-06-10 ENCOUNTER — Telehealth: Payer: Self-pay

## 2018-06-10 ENCOUNTER — Other Ambulatory Visit: Payer: Self-pay | Admitting: Internal Medicine

## 2018-06-10 DIAGNOSIS — G43009 Migraine without aura, not intractable, without status migrainosus: Secondary | ICD-10-CM

## 2018-06-10 MED ORDER — RIZATRIPTAN BENZOATE 10 MG PO TABS: 10.0000 mg | ORAL_TABLET | ORAL | 5 refills | Status: DC | PRN

## 2018-06-10 NOTE — Telephone Encounter (Signed)
Patient informed. Will call if having any probs with medication.

## 2018-06-10 NOTE — Telephone Encounter (Signed)
Tried doing a PA on patient medication for Eletriptain 40 mg tablets ( 12 tabs for 30 day supply )  Denied coverage saying patient needs to try one of the following:  SUMATRIPTAN TABS 9'S 25MG   SUMATRIPTAN TABS 9'S 50MG   SUMATRIPTAN TABS 9'S 100MG   RIZATRIPTAN BENZOATE TABS 1'S 5MG   RIZATRIPTAN BENZOATE TABS 1'S 10MG    Please advise. I spoke with patient and he is willing to take whatever you recommend. Pharmacy is [facility] in [place].

## 2018-06-10 NOTE — Telephone Encounter (Signed)
Maxalt 10 mg sent to [facility].

## 2018-06-30 ENCOUNTER — Encounter: Payer: Self-pay | Admitting: Internal Medicine

## 2018-06-30 ENCOUNTER — Ambulatory Visit (INDEPENDENT_AMBULATORY_CARE_PROVIDER_SITE_OTHER): Payer: Self-pay | Admitting: Internal Medicine

## 2018-06-30 ENCOUNTER — Telehealth: Payer: Self-pay

## 2018-06-30 ENCOUNTER — Other Ambulatory Visit: Payer: Self-pay

## 2018-06-30 VITALS — Ht 65.0 in | Wt 145.0 lb

## 2018-06-30 DIAGNOSIS — Z720 Tobacco use: Secondary | ICD-10-CM

## 2018-06-30 DIAGNOSIS — K219 Gastro-esophageal reflux disease without esophagitis: Secondary | ICD-10-CM

## 2018-06-30 DIAGNOSIS — F17210 Nicotine dependence, cigarettes, uncomplicated: Secondary | ICD-10-CM

## 2018-06-30 MED ORDER — OMEPRAZOLE 40 MG PO CPDR: 40.0000 mg | DELAYED_RELEASE_CAPSULE | Freq: Every day | ORAL | 5 refills | Status: DC

## 2018-06-30 NOTE — Telephone Encounter (Signed)
Patient scheduled for VV

## 2018-06-30 NOTE — Progress Notes (Signed)
Date:  06/30/2018   Name:  Mark Fox   DOB:  [birth year]   MRN:  [id number]  I connected with this patient, Mark Fox, by telephone at the patient's home.  I verified that I am speaking with the correct person using two identifiers. This visit was conducted via telephone due to the Covid-19 outbreak from my office at [facility] in [place], [place]. I discussed the limitations, risks, security and privacy concerns of performing an evaluation and management service by telephone. I also discussed with the patient that there may be a patient responsible charge related to this service. The patient expressed understanding and agreed to proceed.  Chief Complaint: Gastroesophageal Reflux  Gastroesophageal Reflux  He complains of heartburn and nausea. He reports no abdominal pain, no chest pain or no wheezing. This is a chronic problem. The problem occurs rarely. The heartburn is located in the substernum. The heartburn is of mild intensity. The heartburn wakes him from sleep. The heartburn does not limit his activity. The heartburn doesn't change with position. The symptoms are aggravated by smoking. Pertinent negatives include no fatigue. Risk factors include smoking/tobacco exposure, NSAIDs, ETOH use and caffeine use. He has tried a histamine-2 antagonist (taking zantac twice a day and sometimes had to take more) for the symptoms. The treatment provided moderate relief.    Review of Systems  Constitutional: Negative for chills, fatigue and fever.  Respiratory: Negative for chest tightness, shortness of breath and wheezing.   Cardiovascular: Negative for chest pain.  Gastrointestinal: Positive for heartburn and nausea. Negative for abdominal pain, blood in stool, constipation and diarrhea.    Patient Active Problem List   Diagnosis Date Noted  . Gastroesophageal reflux disease 10/03/2017  . Neck muscle spasm 10/03/2017  . Migraine without aura and without status migrainosus,  not intractable 12/19/2015  . Tobacco use 08/01/2015    No Known Allergies  Past Surgical History:  Procedure Laterality Date  . APPENDECTOMY  2015    Social History   Tobacco Use  . Smoking status: Current Every Day Smoker    Packs/day: 0.50    Types: Cigarettes  . Smokeless tobacco: Never Used  Substance Use Topics  . Alcohol use: Yes    Alcohol/week: 5.0 standard drinks    Types: 5 Cans of beer per week  . Drug use: No     Medication list has been reviewed and updated.  Current Meds  Medication Sig  . cyclobenzaprine (FLEXERIL) 10 MG tablet TAKE 1 TABLET BY MOUTH THREE TIMES DAILY AS NEEDED FOR MUSCLE SPASMS  . rizatriptan (MAXALT) 10 MG tablet Take 1 tablet (10 mg total) by mouth as needed for migraine. May repeat in 2 hours if needed    [facility] 2/9 Scores 06/30/2018 10/03/2017 08/01/2015  PHQ - 2 Score 0 1 0    BP Readings from Last 3 Encounters:  10/03/17 110/68  07/12/16 102/64  01/08/16 118/78    Physical Exam Pulmonary:     Comments: No cough, voice change or apparent dyspnea noted during the call. Neurological:     Mental Status: He is alert.  Psychiatric:        Attention and Perception: Attention normal.        Mood and Affect: Mood normal.        Speech: Speech normal.     Wt Readings from Last 3 Encounters:  06/30/18 145 lb (65.8 kg)  10/03/17 142 lb (64.4 kg)  07/12/16 123 lb 9.6 oz (56.1 kg)  Ht 5\' 5"  (1.651 m)   Wt 145 lb (65.8 kg)   BMI 24.13 kg/m   Assessment and Plan: 1. Gastroesophageal reflux disease, esophagitis presence not specified Anti-reflux precautions given Call if sx persist - omeprazole (PRILOSEC) 40 MG capsule; Take 1 capsule (40 mg total) by mouth daily.  Dispense: 30 capsule; Refill: 5  2. Tobacco use Smoking 5 cigarettes per day - continue to cut back with goal to quit completely  I spent 8 minutes on this encounter. Partially dictated using Animal nutritionistDragon software. Any errors are unintentional.  Bari EdwardLaura Iana Buzan, MD  [facility]  06/30/2018

## 2018-06-30 NOTE — Telephone Encounter (Signed)
Was on Ranitidine and [facility] said it is off the shelf at this time. He also said he was having to take it twice a day instead of once so he wants to change to twice daily as it helps more. Advised to check with pharmacy latedr and follow instructions on bottle as we will send in a new med but it may not be twice daily recommended so just follow the instructions. Patient agreed and said he has not failed or tried any other meds in this family.

## 2018-06-30 NOTE — Telephone Encounter (Signed)
He will need a V V to discuss a prescription medication and the appropriate dosing.

## 2018-11-26 ENCOUNTER — Other Ambulatory Visit: Payer: Self-pay | Admitting: Internal Medicine

## 2018-11-26 DIAGNOSIS — K219 Gastro-esophageal reflux disease without esophagitis: Secondary | ICD-10-CM

## 2019-02-10 ENCOUNTER — Telehealth: Payer: Self-pay | Admitting: Nurse Practitioner

## 2019-02-10 DIAGNOSIS — Z20822 Contact with and (suspected) exposure to covid-19: Secondary | ICD-10-CM

## 2019-02-10 DIAGNOSIS — Z20828 Contact with and (suspected) exposure to other viral communicable diseases: Secondary | ICD-10-CM

## 2019-02-10 DIAGNOSIS — R05 Cough: Secondary | ICD-10-CM

## 2019-02-10 DIAGNOSIS — R432 Parageusia: Secondary | ICD-10-CM

## 2019-02-10 DIAGNOSIS — R519 Headache, unspecified: Secondary | ICD-10-CM

## 2019-02-10 DIAGNOSIS — R059 Cough, unspecified: Secondary | ICD-10-CM

## 2019-02-10 DIAGNOSIS — R430 Anosmia: Secondary | ICD-10-CM

## 2019-02-10 MED ORDER — BENZONATATE 100 MG PO CAPS: 100.0000 mg | ORAL_CAPSULE | Freq: Three times a day (TID) | ORAL | 0 refills | Status: DC | PRN

## 2019-02-10 NOTE — Progress Notes (Signed)
,\E-Visit for Corona Virus Screening   Your current symptoms could be consistent with the coronavirus.  Many health care providers can now test patients at their office but not all are.  [facility] has multiple testing sites. For information on our COVID testing locations and hours go to [email or web address]  Please quarantine yourself while awaiting your test results.  We are enrolling you in our [place] for [place] . Daily you will receive a questionnaire within the [facility] website. Our COVID 19 response team willl be monitoriing your responses daily. Please continue good preventive care measures, including:  frequent hand-washing, avoid touching your face, cover coughs/sneezes, stay out of crowds and keep a 6 foot distance from others.    You can go to one of the  testing sites listed below, while they are opened (see hours). You do not need a doctors order to be tested for covid.You do need to self-isolate until your results return and if positive 14 days from when your symptoms started and until you are 3 days symptom free.   Testing Locations (Monday - Friday, 10a.m. - 3:30 p.m.)   [place]: [facility] Entrance), [address], [place], [place]: [address], [place], [place], [place] (entrance off [address] (Closed each Monday): [address] [place], [place] - the short stay covered drive at [facility] (Use the [employer] entrance to [facility] next to [place] is a respiratory illness with symptoms that are similar to the flu. Symptoms are typically mild to moderate, but there have been cases of severe illness and death due to the virus. The following symptoms may appear 2-14 days after exposure: . Fever . Cough . Shortness of breath or difficulty  breathing . Chills . Repeated shaking with chills . Muscle pain . Headache . Sore throat . New loss of taste or smell . Fatigue . Congestion or runny nose . Nausea or vomiting . Diarrhea  If you develop fever/cough/breathlessness, please stay home for 10 days with improving symptoms and until you have had 24 hours of no fever (without taking a fever reducer).  Go to the nearest hospital ED for assessment if fever/cough/breathlessness are severe or illness seems like a threat to life.  It is vitally important that if you feel that you have an infection such as this virus or any other virus that you stay home and away from places where you may spread it to others.  You should avoid contact with people age 60 and older.   You should wear a mask or cloth face covering over your nose and mouth if you must be around other people or animals, including pets (even at home). Try to stay at least 6 feet away from other people. This will protect the people around you.  You can use medication such as A prescription cough medication called Tessalon Perles 100 mg. You may take 1-2 capsules every 8 hours as needed for cough  You may also take acetaminophen (Tylenol) as needed for fever.   Reduce your risk of any infection by using the same precautions used for avoiding the common cold or flu:  Marland Kitchen Wash your hands often with soap and warm water for at least 20 seconds.  If soap and water are not readily available, use an alcohol-based hand sanitizer with at least 60%  alcohol.  . If coughing or sneezing, cover your mouth and nose by coughing or sneezing into the elbow areas of your shirt or coat, into a tissue or into your sleeve (not your hands). . Avoid shaking hands with others and consider head nods or verbal greetings only. . Avoid touching your eyes, nose, or mouth with unwashed hands.  . Avoid close contact with people who are sick. . Avoid places or events with large numbers of people in one location,  like concerts or sporting events. . Carefully consider travel plans you have or are making. . If you are planning any travel outside or inside the [place], visit the CDC's Travelers' Health webpage for the latest health notices. . If you have some symptoms but not all symptoms, continue to monitor at home and seek medical attention if your symptoms worsen. . If you are having a medical emergency, call 911.  HOME CARE . Only take medications as instructed by your medical team. . Drink plenty of fluids and get plenty of rest. . A steam or ultrasonic humidifier can help if you have congestion.   GET HELP RIGHT AWAY IF YOU HAVE EMERGENCY WARNING SIGNS** FOR COVID-19. If you or someone is showing any of these signs seek emergency medical care immediately. Call 911 or proceed to your closest emergency facility if: . You develop worsening high fever. . Trouble breathing . Bluish lips or face . Persistent pain or pressure in the chest . New confusion . Inability to wake or stay awake . You cough up blood. . Your symptoms become more severe  **This list is not all possible symptoms. Contact your medical provider for any symptoms that are sever or concerning to you.   MAKE SURE YOU   Understand these instructions.  Will watch your condition.  Will get help right away if you are not doing well or get worse.  Your e-visit answers were reviewed by a board certified advanced clinical practitioner to complete your personal care plan.  Depending on the condition, your plan could have included both over the counter or prescription medications.  If there is a problem please reply once you have received a response from your provider.  Your safety is important to [place].  If you have drug allergies check your prescription carefully.    You can use MyChart to ask questions about today's visit, request a non-urgent call back, or ask for a work or school excuse for 24 hours related to this e-Visit. If it has  been greater than 24 hours you will need to follow up with your provider, or enter a new e-Visit to address those concerns. You will get an e-mail in the next two days asking about your experience.  I hope that your e-visit has been valuable and will speed your recovery. Thank you for using e-visits.   5-10 minutes spent reviewing and documenting in chart.

## 2019-03-21 ENCOUNTER — Other Ambulatory Visit: Payer: Self-pay | Admitting: Internal Medicine

## 2019-03-21 DIAGNOSIS — M62838 Other muscle spasm: Secondary | ICD-10-CM

## 2019-04-16 ENCOUNTER — Other Ambulatory Visit: Payer: Self-pay | Admitting: Internal Medicine

## 2019-04-16 DIAGNOSIS — G43009 Migraine without aura, not intractable, without status migrainosus: Secondary | ICD-10-CM

## 2019-04-16 DIAGNOSIS — M62838 Other muscle spasm: Secondary | ICD-10-CM

## 2019-05-14 ENCOUNTER — Other Ambulatory Visit: Payer: Self-pay | Admitting: Internal Medicine

## 2019-05-14 DIAGNOSIS — K219 Gastro-esophageal reflux disease without esophagitis: Secondary | ICD-10-CM

## 2019-06-11 ENCOUNTER — Other Ambulatory Visit: Payer: Self-pay | Admitting: Internal Medicine

## 2019-06-11 DIAGNOSIS — M62838 Other muscle spasm: Secondary | ICD-10-CM

## 2019-07-23 ENCOUNTER — Other Ambulatory Visit: Payer: Self-pay | Admitting: Internal Medicine

## 2019-07-23 DIAGNOSIS — M62838 Other muscle spasm: Secondary | ICD-10-CM

## 2019-07-23 NOTE — Telephone Encounter (Signed)
Requested medication (s) are due for refill today: yes  Requested medication (s) are on the active medication list: yes  Last refill: 06/11/19  Future visit scheduled: yes  Notes to clinic:  not delegated    Requested Prescriptions  Pending Prescriptions Disp Refills   cyclobenzaprine (FLEXERIL) 10 MG tablet [Pharmacy Med Name: CYCLOBENZAPRINE 10MG  TABLETS] 90 tablet 0    Sig: TAKE 1 TABLET BY MOUTH THREE TIMES DAILY AS NEEDED FOR MUSCLE SPASMS      Not Delegated - Analgesics:  Muscle Relaxants Failed - 07/23/2019  7:51 AM      Failed - This refill cannot be delegated      Failed - Valid encounter within last 6 months    Recent Outpatient Visits           1 year ago Gastroesophageal reflux disease, esophagitis presence not specified   [facility]   1 year ago Gastroesophageal reflux disease, esophagitis presence not specified   [facility], MD   3 years ago Migraine without aura and without status migrainosus, not intractable   [facility], MD   3 years ago Migraine without aura and without status migrainosus, not intractable   [facility], MD   3 years ago Migraine without aura and without status migrainosus, not intractable   [facility] Reubin Milan, MD       Future Appointments             In 4 days Reubin Milan, MD [facility], [facility]

## 2019-07-27 ENCOUNTER — Ambulatory Visit: Payer: Self-pay | Admitting: Internal Medicine

## 2019-07-28 ENCOUNTER — Other Ambulatory Visit: Payer: Self-pay

## 2019-07-28 ENCOUNTER — Encounter: Payer: Self-pay | Admitting: Internal Medicine

## 2019-07-28 ENCOUNTER — Ambulatory Visit: Payer: Managed Care, Other (non HMO) | Admitting: Internal Medicine

## 2019-07-28 VITALS — BP 112/68 | HR 71 | Temp 98.2°F | Ht 65.0 in | Wt 151.0 lb

## 2019-07-28 DIAGNOSIS — G43009 Migraine without aura, not intractable, without status migrainosus: Secondary | ICD-10-CM

## 2019-07-28 DIAGNOSIS — F411 Generalized anxiety disorder: Secondary | ICD-10-CM | POA: Diagnosis not present

## 2019-07-28 MED ORDER — SERTRALINE HCL 50 MG PO TABS: 50.0000 mg | ORAL_TABLET | Freq: Every day | ORAL | 3 refills | Status: DC

## 2019-07-28 MED ORDER — ELETRIPTAN HYDROBROMIDE 40 MG PO TABS: ORAL_TABLET | ORAL | 5 refills | Status: DC

## 2019-07-28 NOTE — Progress Notes (Signed)
Date:  07/28/2019   Name:  Mark Fox   DOB:  1991/01/12   MRN:  [id number]   Chief Complaint: Anxiety  Anxiety Presents for initial visit. Episode onset: started about a year ago. The problem has been gradually worsening. Symptoms include chest pain, excessive worry and nervous/anxious behavior. Patient reports no dizziness, palpitations, shortness of breath or suicidal ideas. Symptoms occur most days. The symptoms are aggravated by work stress. The quality of sleep is fair.   There is no history of depression or suicide attempts. Past treatments include counseling (CBT).  Migraine  This is a recurrent problem. The problem has been gradually improving. The pain is located in the occipital region. The pain radiates to the face. The pain quality is similar to prior headaches. Pertinent negatives include no abdominal pain, coughing, dizziness or fever. He has tried triptans (chiropractic care/ previously took Relpax but new insurance changed him to [employer]) for the symptoms. The treatment provided moderate relief.    Lab Results  Component Value Date   CREATININE 1.17 08/19/2013   BUN 12 08/19/2013   NA 141 08/19/2013   K 3.9 08/19/2013   CL 104 08/19/2013   CO2 28 08/19/2013   No results found for: CHOL, HDL, LDLCALC, LDLDIRECT, TRIG, CHOLHDL No results found for: TSH No results found for: HGBA1C Lab Results  Component Value Date   WBC 19.0 (H) 08/19/2013   HGB 15.8 08/19/2013   HCT 48.8 08/19/2013   MCV 90 08/19/2013   PLT 268 08/19/2013   Lab Results  Component Value Date   ALT 13 08/19/2013   AST 14 (L) 08/19/2013   ALKPHOS 76 08/19/2013   BILITOT 0.5 08/19/2013     Review of Systems  Constitutional: Negative for chills, fatigue and fever.  Respiratory: Positive for chest tightness. Negative for cough, shortness of breath and wheezing.   Cardiovascular: Positive for chest pain. Negative for palpitations.  Gastrointestinal: Negative for abdominal pain.    Neurological: Positive for headaches. Negative for dizziness and light-headedness.  Psychiatric/Behavioral: Positive for dysphoric mood and sleep disturbance. Negative for suicidal ideas. The patient is nervous/anxious.     Patient Active Problem List   Diagnosis Date Noted  . Gastroesophageal reflux disease 10/03/2017  . Neck muscle spasm 10/03/2017  . Migraine without aura and without status migrainosus, not intractable 12/19/2015  . Tobacco use 08/01/2015    No Known Allergies  Past Surgical History:  Procedure Laterality Date  . APPENDECTOMY  2015    Social History   Tobacco Use  . Smoking status: Current Every Day Smoker    Packs/day: 0.25    Types: Cigarettes  . Smokeless tobacco: Never Used  . Tobacco comment: 3-4 daily   Substance Use Topics  . Alcohol use: Yes    Alcohol/week: 5.0 standard drinks    Types: 5 Cans of beer per week  . Drug use: No     Medication list has been reviewed and updated.  Current Meds  Medication Sig  . cyclobenzaprine (FLEXERIL) 10 MG tablet TAKE 1 TABLET BY MOUTH THREE TIMES DAILY AS NEEDED FOR MUSCLE SPASMS  . omeprazole (PRILOSEC) 40 MG capsule TAKE 1 CAPSULE(40 MG) BY MOUTH DAILY  . rizatriptan (MAXALT) 10 MG tablet TAKE 1 TABLET BY MOUTH ONCE AS NEEDED FOR MIGRAINE. MAY REPEAT DOSE IN 2 HOURS IF NEEDED.    PHQ 2/9 Scores 07/28/2019 06/30/2018 10/03/2017 08/01/2015  PHQ - 2 Score 2 0 1 0  PHQ- 9 Score 10 - - -  GAD 7 : Generalized Anxiety Score 07/28/2019  Nervous, Anxious, on Edge 2  Control/stop worrying 2  Worry too much - different things 2  Trouble relaxing 1  Restless 2  Easily annoyed or irritable 2  Afraid - awful might happen 3  Total GAD 7 Score 14  Anxiety Difficulty Somewhat difficult      BP Readings from Last 3 Encounters:  07/28/19 112/68  10/03/17 110/68  07/12/16 102/64    Physical Exam Vitals and nursing note reviewed.  Constitutional:      General: He is not in acute distress.    Appearance:  Normal appearance. He is well-developed.  HENT:     Head: Normocephalic and atraumatic.  Cardiovascular:     Rate and Rhythm: Normal rate and regular rhythm.     Heart sounds: No murmur.  Pulmonary:     Effort: Pulmonary effort is normal. No respiratory distress.     Breath sounds: No wheezing or rhonchi.  Musculoskeletal:     Cervical back: Normal range of motion.     Right lower leg: No edema.     Left lower leg: No edema.  Lymphadenopathy:     Cervical: No cervical adenopathy.  Skin:    General: Skin is warm and dry.     Findings: No rash.  Neurological:     Mental Status: He is alert and oriented to person, place, and time.  Psychiatric:        Attention and Perception: Attention normal.        Mood and Affect: Mood and affect normal.        Speech: Speech normal.        Behavior: Behavior normal.        Thought Content: Thought content normal. Thought content does not include suicidal ideation. Thought content does not include suicidal plan.     Wt Readings from Last 3 Encounters:  07/28/19 151 lb (68.5 kg)  06/30/18 145 lb (65.8 kg)  10/03/17 142 lb (64.4 kg)    BP 112/68   Pulse 71   Temp 98.2 F (36.8 C) (Temporal)   Ht 5\' 5"  (1.651 m)   Wt 151 lb (68.5 kg)   SpO2 97%   BMI 25.13 kg/m   Assessment and Plan: 1. 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  Dispense: 30 tablet; Refill: 3  2. Migraine without aura and without status migrainosus, not intractable Taking Maxalt with some relief but prefers Relpax which seems to be more beneficial He will continue chiropractic care  Will send in  Relpax and see if it is covered - eletriptan (RELPAX) 40 MG tablet; TAKE 1 TABLET BY MOUTH AS NEEDED FOR MIGRAINE HEADACHE. MAY REPEAT IN 2 HOURS IF HEADACHE PERSISTS OR RECURS.  Dispense: 12 tablet; Refill: 5   Partially dictated using Editor, commissioning. Any errors are unintentional.  Halina Maidens, MD [facility]  07/28/2019

## 2019-09-06 ENCOUNTER — Other Ambulatory Visit: Payer: Self-pay | Admitting: Internal Medicine

## 2019-09-06 DIAGNOSIS — M62838 Other muscle spasm: Secondary | ICD-10-CM

## 2019-09-06 NOTE — Telephone Encounter (Signed)
Requested medication (s) are due for refill today: yes  Requested medication (s) are on the active medication list: yes  Last refill: 07/26/2019   #90  0 refills  Future visit scheduled  no  Notes to clinic: not delegated  Requested Prescriptions  Pending Prescriptions Disp Refills   cyclobenzaprine (FLEXERIL) 10 MG tablet [Pharmacy Med Name: CYCLOBENZAPRINE 10MG  TABLETS] 90 tablet 0    Sig: TAKE 1 TABLET BY MOUTH THREE TIMES DAILY AS NEEDED FOR MUSCLE SPASMS      Not Delegated - Analgesics:  Muscle Relaxants Failed - 09/06/2019  5:47 PM      Failed - This refill cannot be delegated      Passed - Valid encounter within last 6 months    Recent Outpatient Visits           1 month ago Generalized anxiety disorder   [facility]   1 year ago Gastroesophageal reflux disease, esophagitis presence not specified   [facility], [place]   1 year ago Gastroesophageal reflux disease, esophagitis presence not specified   [facility], MD   3 years ago Migraine without aura and without status migrainosus, not intractable   [facility], [place]   3 years ago Migraine without aura and without status migrainosus, not intractable   [facility] [place]

## 2019-09-07 NOTE — Telephone Encounter (Signed)
Please Advise

## 2019-10-19 ENCOUNTER — Other Ambulatory Visit: Payer: Self-pay | Admitting: Internal Medicine

## 2019-10-19 DIAGNOSIS — K219 Gastro-esophageal reflux disease without esophagitis: Secondary | ICD-10-CM

## 2019-11-10 ENCOUNTER — Other Ambulatory Visit: Payer: Self-pay | Admitting: Internal Medicine

## 2019-11-10 DIAGNOSIS — M62838 Other muscle spasm: Secondary | ICD-10-CM

## 2019-12-15 ENCOUNTER — Other Ambulatory Visit: Payer: Self-pay | Admitting: Internal Medicine

## 2019-12-15 ENCOUNTER — Other Ambulatory Visit: Payer: Self-pay | Admitting: Nurse Practitioner

## 2019-12-15 DIAGNOSIS — F411 Generalized anxiety disorder: Secondary | ICD-10-CM

## 2019-12-15 NOTE — Telephone Encounter (Signed)
Requested medication (s) are due for refill today: Yes  Requested medication (s) are on the active medication list: Yes  Last refill:  07/28/19  Future visit scheduled: No  Notes to clinic:  Provider's last note states pt. Needed to return in July.    Requested Prescriptions  Pending Prescriptions Disp Refills   sertraline (ZOLOFT) 50 MG tablet [Pharmacy Med Name: SERTRALINE 50MG  TABLETS] 30 tablet 3    Sig: TAKE 1 TABLET(50 MG) BY MOUTH DAILY      Psychiatry:  Antidepressants - SSRI Passed - 12/15/2019  8:17 AM      Passed - Valid encounter within last 6 months    Recent Outpatient Visits           4 months ago Generalized anxiety disorder   [facility] [place]   1 year ago Gastroesophageal reflux disease, esophagitis presence not specified   [facility], MD   2 years ago Gastroesophageal reflux disease, esophagitis presence not specified   [facility], MD   3 years ago Migraine without aura and without status migrainosus, not intractable   [facility], MD   3 years ago Migraine without aura and without status migrainosus, not intractable   [facility], MD

## 2020-03-04 ENCOUNTER — Other Ambulatory Visit: Payer: Self-pay | Admitting: Internal Medicine

## 2020-03-04 DIAGNOSIS — K219 Gastro-esophageal reflux disease without esophagitis: Secondary | ICD-10-CM

## 2020-04-04 ENCOUNTER — Other Ambulatory Visit: Payer: Self-pay | Admitting: Internal Medicine

## 2020-04-04 DIAGNOSIS — F411 Generalized anxiety disorder: Secondary | ICD-10-CM

## 2020-04-05 NOTE — Telephone Encounter (Signed)
Called to leave voice message with patient to call back to set up appointment.

## 2020-07-12 ENCOUNTER — Other Ambulatory Visit: Payer: Self-pay

## 2020-07-12 ENCOUNTER — Other Ambulatory Visit: Payer: Self-pay | Admitting: Internal Medicine

## 2020-07-12 DIAGNOSIS — F411 Generalized anxiety disorder: Secondary | ICD-10-CM

## 2020-07-12 MED ORDER — SERTRALINE HCL 50 MG PO TABS: ORAL_TABLET | ORAL | 0 refills | Status: DC

## 2020-07-12 NOTE — Telephone Encounter (Signed)
Copied from CRM [id number]. Topic: Quick Communication - Rx Refill/Question >> Jul 12, 2020  9:42 AM Lynne Logan D wrote: Medication: sertraline (ZOLOFT) 50 MG tablet   Has the patient contacted their pharmacy? Yes.   (Agent: If no, request that the patient contact the pharmacy for the refill.) (Agent: If yes, when and what did the pharmacy advise?) Contact PCP for future refills  Preferred Pharmacy (with phone number or street name): [facility] [phone] - [place], [place] - [address] AT [facility] [address]  Phone:  [phone] Fax:  [phone]     Agent: Please be advised that RX refills may take up to 3 business days. We ask that you follow-up with your pharmacy.

## 2020-07-23 NOTE — Progress Notes (Signed)
Date:  07/24/2020   Name:  Mark Fox   DOB:  [birth year]   MRN:  [id number]   Chief Complaint: Annual Exam  Mark Fox is a 30 y.o. male who presents today for his Complete Annual Exam. He feels well. He reports exercising plays golf X2-3 times a week but quit going to the gym several months ago. He reports he is sleeping well taking melatonin.  Immunization History  Administered Date(s) Administered  . PFIZER(Purple Top)SARS-COV-2 Vaccination 07/07/2019, 08/12/2019    Gastroesophageal Reflux He complains of heartburn. He reports no abdominal pain, no chest pain, no choking or no wheezing. This is a recurrent problem. The problem occurs occasionally. The heartburn is located in the substernum. Pertinent negatives include no fatigue. He has tried a PPI for the symptoms. The treatment provided moderate relief.  Migraine  This is a recurrent problem. The problem occurs monthly. The problem has been gradually improving. The pain quality is similar to prior headaches. Pertinent negatives include no abdominal pain, back pain, dizziness, hearing loss or tinnitus.  Anxiety Symptoms include nervous/anxious behavior (mild anxiety). Patient reports no chest pain, dizziness, palpitations or shortness of breath.      Lab Results  Component Value Date   CREATININE 1.17 08/19/2013   BUN 12 08/19/2013   NA 141 08/19/2013   K 3.9 08/19/2013   CL 104 08/19/2013   CO2 28 08/19/2013   No results found for: CHOL, HDL, LDLCALC, LDLDIRECT, TRIG, CHOLHDL No results found for: TSH No results found for: HGBA1C Lab Results  Component Value Date   WBC 19.0 (H) 08/19/2013   HGB 15.8 08/19/2013   HCT 48.8 08/19/2013   MCV 90 08/19/2013   PLT 268 08/19/2013   Lab Results  Component Value Date   ALT 13 08/19/2013   AST 14 (L) 08/19/2013   ALKPHOS 76 08/19/2013   BILITOT 0.5 08/19/2013     Review of Systems  Constitutional: Negative for appetite change, chills,  diaphoresis, fatigue and unexpected weight change.  HENT: Negative for hearing loss, tinnitus, trouble swallowing and voice change.   Eyes: Negative for visual disturbance.  Respiratory: Negative for choking, shortness of breath and wheezing.   Cardiovascular: Negative for chest pain, palpitations and leg swelling.  Gastrointestinal: Positive for heartburn. Negative for abdominal pain, blood in stool, constipation and diarrhea.  Genitourinary: Negative for difficulty urinating, dysuria and frequency.  Musculoskeletal: Negative for arthralgias, back pain and myalgias.  Skin: Negative for color change and rash.  Neurological: Negative for dizziness, syncope and headaches.  Hematological: Negative for adenopathy.  Psychiatric/Behavioral: Negative for dysphoric mood and sleep disturbance. The patient is nervous/anxious (mild anxiety).     Patient Active Problem List   Diagnosis Date Noted  . Generalized anxiety disorder 07/28/2019  . Gastroesophageal reflux disease 10/03/2017  . Neck muscle spasm 10/03/2017  . Migraine without aura and without status migrainosus, not intractable 12/19/2015  . Tobacco use 08/01/2015    No Known Allergies  Past Surgical History:  Procedure Laterality Date  . APPENDECTOMY  2015    Social History   Tobacco Use  . Smoking status: Current Every Day Smoker    Packs/day: 0.25    Types: Cigarettes  . Smokeless tobacco: Never Used  . Tobacco comment: 3-4 daily   Vaping Use  . Vaping Use: Never used  Substance Use Topics  . Alcohol use: Yes    Alcohol/week: 5.0 standard drinks    Types: 5 Cans of beer per week  .  Drug use: No     Medication list has been reviewed and updated.  Current Meds  Medication Sig  . cyclobenzaprine (FLEXERIL) 10 MG tablet TAKE 1 TABLET BY MOUTH THREE TIMES DAILY AS NEEDED FOR MUSCLE SPASMS  . eletriptan (RELPAX) 40 MG tablet TAKE 1 TABLET BY MOUTH AS NEEDED FOR MIGRAINE HEADACHE. MAY REPEAT IN 2 HOURS IF HEADACHE  PERSISTS OR RECURS.  Marland Kitchen omeprazole (PRILOSEC) 40 MG capsule TAKE 1 CAPSULE(40 MG) BY MOUTH DAILY  . sertraline (ZOLOFT) 50 MG tablet TAKE 1 TABLET(50 MG) BY MOUTH DAILY    PHQ 2/9 Scores 07/24/2020 07/28/2019 06/30/2018 10/03/2017  PHQ - 2 Score 0 2 0 1  PHQ- 9 Score 0 10 - -    GAD 7 : Generalized Anxiety Score 07/24/2020 07/28/2019  Nervous, Anxious, on Edge 1 2  Control/stop worrying 1 2  Worry too much - different things 1 2  Trouble relaxing 0 1  Restless 0 2  Easily annoyed or irritable 1 2  Afraid - awful might happen 0 3  Total GAD 7 Score 4 14  Anxiety Difficulty - Somewhat difficult    BP Readings from Last 3 Encounters:  07/24/20 128/86  07/28/19 112/68  10/03/17 110/68    Physical Exam Vitals and nursing note reviewed.  Constitutional:      Appearance: Normal appearance. He is well-developed.  HENT:     Head: Normocephalic.     Right Ear: Tympanic membrane, ear canal and external ear normal.     Left Ear: Tympanic membrane, ear canal and external ear normal.     Nose: Nose normal.  Eyes:     Conjunctiva/sclera: Conjunctivae normal.     Pupils: Pupils are equal, round, and reactive to light.  Neck:     Thyroid: No thyromegaly.     Vascular: No carotid bruit.  Cardiovascular:     Rate and Rhythm: Normal rate and regular rhythm.     Heart sounds: Normal heart sounds.  Pulmonary:     Effort: Pulmonary effort is normal.     Breath sounds: Normal breath sounds. No wheezing.  Chest:  Breasts:     Right: No mass.     Left: No mass.    Abdominal:     General: Bowel sounds are normal.     Palpations: Abdomen is soft.     Tenderness: There is no abdominal tenderness.  Musculoskeletal:        General: Normal range of motion.     Cervical back: Normal range of motion and neck supple.  Lymphadenopathy:     Cervical: No cervical adenopathy.  Skin:    General: Skin is warm and dry.     Capillary Refill: Capillary refill takes less than 2 seconds.  Neurological:      General: No focal deficit present.     Mental Status: He is alert and oriented to person, place, and time.     Deep Tendon Reflexes: Reflexes are normal and symmetric.  Psychiatric:        Attention and Perception: Attention normal.        Mood and Affect: Mood normal.        Thought Content: Thought content normal.     Wt Readings from Last 3 Encounters:  07/24/20 146 lb (66.2 kg)  07/28/19 151 lb (68.5 kg)  06/30/18 145 lb (65.8 kg)    BP 128/86   Pulse 82   Temp 98.1 F (36.7 C) (Oral)   Ht 5\' 5"  (1.651 m)  Wt 146 lb (66.2 kg)   SpO2 95%   BMI 24.30 kg/m   Assessment and Plan: 1. Annual physical exam Normal exam.  Continue healthy diet, resume more regular aerobic exercise - Comprehensive metabolic panel - Lipid panel - HIV Antibody (routine testing w rflx)  2. Migraine without aura and without status migrainosus, not intractable Migraine respond well to Relpax; occurring only about once a month - eletriptan (RELPAX) 40 MG tablet; TAKE 1 TABLET BY MOUTH AS NEEDED FOR MIGRAINE HEADACHE. MAY REPEAT IN 2 HOURS IF HEADACHE PERSISTS OR RECURS.  Dispense: 12 tablet; Refill: 5  3. Gastroesophageal reflux disease, unspecified whether esophagitis present Symptoms well controlled on daily PPI No red flag signs such as weight loss, n/v, melena Will continue omeprazole. - CBC with Differential/Platelet - omeprazole (PRILOSEC) 40 MG capsule; Take 1 capsule (40 mg total) by mouth daily.  Dispense: 90 capsule; Refill: 3  4. Generalized anxiety disorder Doing very well on Zoloft - no side effects, no SI/HI - TSH - sertraline (ZOLOFT) 50 MG tablet; TAKE 1 TABLET(50 MG) BY MOUTH DAILY  Dispense: 90 tablet; Refill: 3  5. Need for hepatitis C screening test - Hepatitis C antibody   Partially dictated using Animal nutritionist. Any errors are unintentional.  Bari Edward, MD [facility]  07/24/2020

## 2020-07-24 ENCOUNTER — Ambulatory Visit (INDEPENDENT_AMBULATORY_CARE_PROVIDER_SITE_OTHER): Payer: Managed Care, Other (non HMO) | Admitting: Internal Medicine

## 2020-07-24 ENCOUNTER — Encounter: Payer: Self-pay | Admitting: Internal Medicine

## 2020-07-24 ENCOUNTER — Other Ambulatory Visit: Payer: Self-pay

## 2020-07-24 VITALS — BP 128/86 | HR 82 | Temp 98.1°F | Ht 65.0 in | Wt 146.0 lb

## 2020-07-24 DIAGNOSIS — Z0000 Encounter for general adult medical examination without abnormal findings: Secondary | ICD-10-CM | POA: Diagnosis not present

## 2020-07-24 DIAGNOSIS — G43009 Migraine without aura, not intractable, without status migrainosus: Secondary | ICD-10-CM | POA: Diagnosis not present

## 2020-07-24 DIAGNOSIS — Z1159 Encounter for screening for other viral diseases: Secondary | ICD-10-CM | POA: Diagnosis not present

## 2020-07-24 DIAGNOSIS — F411 Generalized anxiety disorder: Secondary | ICD-10-CM

## 2020-07-24 DIAGNOSIS — K219 Gastro-esophageal reflux disease without esophagitis: Secondary | ICD-10-CM

## 2020-07-24 MED ORDER — SERTRALINE HCL 50 MG PO TABS: ORAL_TABLET | ORAL | 3 refills | Status: DC

## 2020-07-24 MED ORDER — OMEPRAZOLE 40 MG PO CPDR: 40.0000 mg | DELAYED_RELEASE_CAPSULE | Freq: Every day | ORAL | 3 refills | Status: AC

## 2020-07-24 MED ORDER — ELETRIPTAN HYDROBROMIDE 40 MG PO TABS: ORAL_TABLET | ORAL | 5 refills | Status: DC

## 2020-07-24 NOTE — Patient Instructions (Signed)

## 2020-07-31 ENCOUNTER — Telehealth: Payer: Self-pay

## 2020-07-31 NOTE — Telephone Encounter (Signed)
Called pt left VM reminding pt to get labs drawn.  KP

## 2020-08-23 ENCOUNTER — Other Ambulatory Visit: Payer: Self-pay | Admitting: Internal Medicine

## 2020-08-23 DIAGNOSIS — M62838 Other muscle spasm: Secondary | ICD-10-CM

## 2020-08-23 NOTE — Telephone Encounter (Signed)
Requested medication (s) are due for refill today:no  Requested medication (s) are on the active medication list: yes   Last refill:  05/08/2020  Future visit scheduled: yes  Notes to clinic:  this refill cannot be delegated    Requested Prescriptions  Pending Prescriptions Disp Refills   cyclobenzaprine (FLEXERIL) 10 MG tablet [Pharmacy Med Name: CYCLOBENZAPRINE 10MG  TABLETS] 90 tablet 3    Sig: TAKE 1 TABLET BY MOUTH THREE TIMES DAILY AS NEEDED FOR MUSCLE SPASMS      Not Delegated - Analgesics:  Muscle Relaxants Failed - 08/23/2020  8:36 AM      Failed - This refill cannot be delegated      Passed - Valid encounter within last 6 months    Recent Outpatient Visits           1 month ago Annual physical exam   [facility]   1 year ago Generalized anxiety disorder   [facility], [place]   2 years ago Gastroesophageal reflux disease, esophagitis presence not specified   [facility], [facility]   2 years ago Gastroesophageal reflux disease, esophagitis presence not specified   [facility], MD   4 years ago Migraine without aura and without status migrainosus, not intractable   [facility] Reubin Milan, MD       Future Appointments             In 11 months Reubin Milan Judithann Graves, MD [facility]

## 2021-01-30 ENCOUNTER — Other Ambulatory Visit: Payer: Self-pay | Admitting: Internal Medicine

## 2021-01-30 DIAGNOSIS — M62838 Other muscle spasm: Secondary | ICD-10-CM

## 2021-01-30 NOTE — Telephone Encounter (Signed)
Requested medication (s) are due for refill today: Yes  Requested medication (s) are on the active medication list: Yes  Last refill:  08/23/20  Future visit scheduled: Yes  Notes to clinic:  See request.    Requested Prescriptions  Pending Prescriptions Disp Refills   cyclobenzaprine (FLEXERIL) 10 MG tablet [Pharmacy Med Name: CYCLOBENZAPRINE 10MG  TABLETS] 90 tablet 3    Sig: TAKE 1 TABLET BY MOUTH THREE TIMES DAILY AS NEEDED FOR MUSCLE SPASMS     Not Delegated - Analgesics:  Muscle Relaxants Failed - 01/30/2021  9:04 AM      Failed - This refill cannot be delegated      Failed - Valid encounter within last 6 months    Recent Outpatient Visits           6 months ago Annual physical exam   [facility], [facility]   1 year ago Generalized anxiety disorder   [facility], [facility]   2 years ago Gastroesophageal reflux disease, esophagitis presence not specified   [facility], [facility]   3 years ago Gastroesophageal reflux disease, esophagitis presence not specified   [facility], MD   4 years ago Migraine without aura and without status migrainosus, not intractable   [facility] Reubin Milan, MD       Future Appointments             In 5 months Reubin Milan Judithann Graves, MD [facility], [facility]

## 2021-03-07 ENCOUNTER — Other Ambulatory Visit: Payer: Self-pay | Admitting: Internal Medicine

## 2021-03-07 DIAGNOSIS — M62838 Other muscle spasm: Secondary | ICD-10-CM

## 2021-03-07 NOTE — Telephone Encounter (Signed)
Requested medications are due for refill today.  yes  Requested medications are on the active medications list.  yes  Last refill. 01/30/2021  Future visit scheduled.   yes  Notes to clinic.  Medication not delegated.    Requested Prescriptions  Pending Prescriptions Disp Refills   cyclobenzaprine (FLEXERIL) 10 MG tablet [Pharmacy Med Name: CYCLOBENZAPRINE 10MG  TABLETS] 90 tablet 0    Sig: TAKE 1 TABLET BY MOUTH THREE TIMES DAILY AS NEEDED FOR MUSCLE SPASMS     Not Delegated - Analgesics:  Muscle Relaxants Failed - 03/07/2021  6:37 AM      Failed - This refill cannot be delegated      Failed - Valid encounter within last 6 months    Recent Outpatient Visits           7 months ago Annual physical exam   [facility], [facility]   1 year ago Generalized anxiety disorder   [facility], [place]   2 years ago Gastroesophageal reflux disease, esophagitis presence not specified   [facility], [facility]   3 years ago Gastroesophageal reflux disease, esophagitis presence not specified   [facility], [facility]   4 years ago Migraine without aura and without status migrainosus, not intractable   [facility] Reubin Milan, MD       Future Appointments             In 4 months Reubin Milan Judithann Graves, MD [facility], [facility]

## 2021-03-12 ENCOUNTER — Other Ambulatory Visit: Payer: Self-pay | Admitting: Internal Medicine

## 2021-03-12 DIAGNOSIS — K219 Gastro-esophageal reflux disease without esophagitis: Secondary | ICD-10-CM

## 2021-03-13 NOTE — Telephone Encounter (Signed)
Requested Prescriptions  Refused Prescriptions Disp Refills   omeprazole (PRILOSEC) 40 MG capsule [Pharmacy Med Name: OMEPRAZOLE 40MG  CAPSULES] 90 capsule 3    Sig: TAKE 1 CAPSULE(40 MG) BY MOUTH DAILY     Gastroenterology: Proton Pump Inhibitors Passed - 03/12/2021  8:08 AM      Passed - Valid encounter within last 12 months    Recent Outpatient Visits           7 months ago Annual physical exam   [facility], [facility]   1 year ago Generalized anxiety disorder   [facility], MD   2 years ago Gastroesophageal reflux disease, esophagitis presence not specified   [facility], MD   3 years ago Gastroesophageal reflux disease, esophagitis presence not specified   [facility], MD   4 years ago Migraine without aura and without status migrainosus, not intractable   [facility] Reubin Milan, MD       Future Appointments             In 4 months Reubin Milan Judithann Graves, MD [facility], [facility]

## 2021-03-13 NOTE — Telephone Encounter (Signed)
last RF 07/24/20 # 90 3 RF should have enough med to last until Apr

## 2021-07-20 ENCOUNTER — Telehealth: Payer: Self-pay

## 2021-07-20 NOTE — Telephone Encounter (Signed)
PA completed waiting on insurance approval. ? ?Key: [id number] ? ?KP ?

## 2021-07-24 ENCOUNTER — Telehealth: Payer: Self-pay

## 2021-07-24 NOTE — Telephone Encounter (Signed)
Called pt and left VM asking him to call the office back. Let him know [employer] denied approval for Eletriptan.  ?He has failed topamax and Rizatriptan but still needs to try and fail sumatriptan. ? ?Waiting for pt to call back to let us know if he will be willing to try sumatriptan., If so we will send to pharmacy. ? ?Crm created for [facility]. ?

## 2021-07-24 NOTE — Telephone Encounter (Signed)
Denied  KP 

## 2021-07-25 ENCOUNTER — Encounter: Payer: Managed Care, Other (non HMO) | Admitting: Internal Medicine

## 2021-07-25 ENCOUNTER — Encounter: Payer: Self-pay | Admitting: Internal Medicine

## 2021-07-25 ENCOUNTER — Other Ambulatory Visit: Payer: Self-pay | Admitting: Internal Medicine

## 2021-07-25 DIAGNOSIS — F411 Generalized anxiety disorder: Secondary | ICD-10-CM

## 2021-07-25 DIAGNOSIS — G43009 Migraine without aura, not intractable, without status migrainosus: Secondary | ICD-10-CM

## 2021-07-25 MED ORDER — SERTRALINE HCL 50 MG PO TABS: ORAL_TABLET | ORAL | 3 refills | Status: AC

## 2021-07-25 NOTE — Telephone Encounter (Signed)
Requested medication (s) are due for refill today: yes ? ?Requested medication (s) are on the active medication list: yes ? ?Last refill:  07/24/20 for both requested beds ? ?Future visit scheduled: no ? ?Notes to clinic:  pt is no longer a pt at [facility]- pt stated he has upcoming appt 09/11/20/ pt is asking if a short supply refill until 09/11/20 ? ? ?Requested Prescriptions  ?Pending Prescriptions Disp Refills  ? eletriptan (RELPAX) 40 MG tablet 12 tablet 5  ?  Sig: TAKE 1 TABLET BY MOUTH AS NEEDED FOR MIGRAINE HEADACHE. MAY REPEAT IN 2 HOURS IF HEADACHE PERSISTS OR RECURS.  ?  ? Neurology:  Migraine Therapy - Triptan Failed - 07/25/2021 10:01 AM  ?  ?  Failed - Valid encounter within last 12 months  ?  Recent Outpatient Visits   ? ?      ? 1 year ago Annual physical exam  ? [facility] Reubin Milan, MD  ? 1 year ago Generalized anxiety disorder  ? [facility] Reubin Milan, MD  ? 3 years ago Gastroesophageal reflux disease, esophagitis presence not specified  ? [facility] Reubin Milan, MD  ? 3 years ago Gastroesophageal reflux disease, esophagitis presence not specified  ? [facility] Reubin Milan, MD  ? 5 years ago Migraine without aura and without status migrainosus, not intractable  ? [facility] Reubin Milan, MD  ? ?  ?  ? ? ?  ?  ?  Passed - Last BP in normal range  ?  BP Readings from Last 1 Encounters:  ?07/24/20 128/86  ?  ?  ?  ?  ? sertraline (ZOLOFT) 50 MG tablet 90 tablet 3  ?  Sig: TAKE 1 TABLET(50 MG) BY MOUTH DAILY  ?  ? Psychiatry:  Antidepressants - SSRI - sertraline Failed - 07/25/2021 10:01 AM  ?  ?  Failed - AST in normal range and within 360 days  ?  SGOT(AST)  ?Date Value Ref Range Status  ?08/19/2013 14 (L) 15 - 37 Unit/L Final  ?  ?  ?  ?  Failed - ALT in normal range and within 360 days  ?  SGPT (ALT)  ?Date Value Ref Range Status  ?08/19/2013 13 12 - 78 U/L Final  ?  ?  ?  ?  Failed - Completed PHQ-2 or PHQ-9 in the last 360  days  ?  ?  Failed - Valid encounter within last 6 months  ?  Recent Outpatient Visits   ? ?      ? 1 year ago Annual physical exam  ? [facility] Reubin Milan, MD  ? 1 year ago Generalized anxiety disorder  ? [facility] Reubin Milan, MD  ? 3 years ago Gastroesophageal reflux disease, esophagitis presence not specified  ? [facility] Reubin Milan, MD  ? 3 years ago Gastroesophageal reflux disease, esophagitis presence not specified  ? [facility] Reubin Milan, MD  ? 5 years ago Migraine without aura and without status migrainosus, not intractable  ? [facility] Reubin Milan, MD  ? ?  ?  ? ? ?  ?  ?  ? ? ? ? ?

## 2021-07-25 NOTE — Telephone Encounter (Signed)
Medication Refill - Medication: eletriptan (RELPAX) 40 MG tablet, sertraline (ZOLOFT) 50 MG tablet ? ?Has the patient contacted their pharmacy? No. ? ?(Agent: If no, request that the patient contact the pharmacy for the refill. If patient does not wish to contact the pharmacy document the reason why and proceed with request.): ? New Pharmacy, patient relocated and would like PCP to send in a supply to hold him over until his NPA scheduled for 6/20 ? ? ? ?Preferred Pharmacy (with phone number or street name): ?[facility]  [address] [place] [address] [phone]  ? ? ? ?Has the patient been seen for an appointment in the last year OR does the patient have an upcoming appointment? Yes.   ? ?Agent: Please be advised that RX refills may take up to 3 business days. We ask that you follow-up with your pharmacy. ?

## 2021-07-25 NOTE — Progress Notes (Deleted)
Date:  07/25/2021   Name:  Mark Fox   DOB:  1990/12/25   MRN:  [id number]   Chief Complaint: No chief complaint on file. Mark Fox is a 31 y.o. male who presents today for his Complete Annual Exam. He feels {DESC; WELL/FAIRLY WELL/POORLY:18703}. He reports exercising ***. He reports he is sleeping {DESC; WELL/FAIRLY WELL/POORLY:18703}.   Colonoscopy: none  Immunization History  Administered Date(s) Administered   PFIZER(Purple Top)SARS-COV-2 Vaccination 07/07/2019, 08/12/2019   Health Maintenance Due  Topic Date Due   Pneumococcal Vaccine 51-44 Years old (1 - PCV) Never done   HIV Screening  Never done   Hepatitis C Screening  Never done   TETANUS/TDAP  Never done   COVID-19 Vaccine (3 - Booster for Pfizer series) 10/07/2019     Gastroesophageal Reflux He complains of heartburn. He reports no abdominal pain, no chest pain, no choking or no wheezing. This is a recurrent problem. The problem occurs occasionally. Pertinent negatives include no fatigue. He has tried a PPI for the symptoms. The treatment provided significant relief.  Migraine  This is a recurrent problem. Pertinent negatives include no abdominal pain, back pain, dizziness, hearing loss or tinnitus. He has tried triptans (eletriptan not covered on insurance) for the symptoms. The treatment provided significant relief.  Anxiety Presents for follow-up visit. Patient reports no chest pain, dizziness, nervous/anxious behavior, palpitations or shortness of breath. The quality of sleep is good.   Compliance with medications is 76-100% (sertraline).   Lab Results  Component Value Date   NA 141 08/19/2013   K 3.9 08/19/2013   CO2 28 08/19/2013   GLUCOSE 100 (H) 08/19/2013   BUN 12 08/19/2013   CREATININE 1.17 08/19/2013   CALCIUM 9.6 08/19/2013   GFRNONAA >60 08/19/2013   No results found for: CHOL, HDL, LDLCALC, LDLDIRECT, TRIG, CHOLHDL No results found for: TSH No results found for:  HGBA1C Lab Results  Component Value Date   WBC 19.0 (H) 08/19/2013   HGB 15.8 08/19/2013   HCT 48.8 08/19/2013   MCV 90 08/19/2013   PLT 268 08/19/2013   Lab Results  Component Value Date   ALT 13 08/19/2013   AST 14 (L) 08/19/2013   ALKPHOS 76 08/19/2013   BILITOT 0.5 08/19/2013   No results found for: 25OHVITD2, 25OHVITD3, VD25OH   Review of Systems  Constitutional:  Negative for appetite change, chills, diaphoresis, fatigue and unexpected weight change.  HENT:  Negative for hearing loss, tinnitus, trouble swallowing and voice change.   Eyes:  Negative for visual disturbance.  Respiratory:  Negative for choking, shortness of breath and wheezing.   Cardiovascular:  Negative for chest pain, palpitations and leg swelling.  Gastrointestinal:  Positive for heartburn. Negative for abdominal pain, blood in stool, constipation and diarrhea.  Genitourinary:  Negative for difficulty urinating, dysuria and frequency.  Musculoskeletal:  Negative for arthralgias, back pain and myalgias.  Skin:  Negative for color change and rash.  Neurological:  Negative for dizziness, syncope and headaches.  Hematological:  Negative for adenopathy.  Psychiatric/Behavioral:  Negative for dysphoric mood and sleep disturbance. The patient is not nervous/anxious.    Patient Active Problem List   Diagnosis Date Noted   Generalized anxiety disorder 07/28/2019   Gastroesophageal reflux disease 10/03/2017   Neck muscle spasm 10/03/2017   Migraine without aura and without status migrainosus, not intractable 12/19/2015   Tobacco use 08/01/2015    No Known Allergies  Past Surgical History:  Procedure Laterality Date   APPENDECTOMY  2015    Social History   Tobacco Use   Smoking status: Every Day    Packs/day: 0.25    Types: Cigarettes   Smokeless tobacco: Never   Tobacco comments:    3-4 daily   Vaping Use   Vaping Use: Never used  Substance Use Topics   Alcohol use: Yes    Alcohol/week: 5.0  standard drinks    Types: 5 Cans of beer per week   Drug use: No     Medication list has been reviewed and updated.  No outpatient medications have been marked as taking for the 07/25/21 encounter (Appointment) with Reubin Milan, MD.       07/24/2020    9:56 AM 07/28/2019    2:51 PM  GAD 7 : Generalized Anxiety Score  Nervous, Anxious, on Edge 1 2  Control/stop worrying 1 2  Worry too much - different things 1 2  Trouble relaxing 0 1  Restless 0 2  Easily annoyed or irritable 1 2  Afraid - awful might happen 0 3  Total GAD 7 Score 4 14  Anxiety Difficulty  Somewhat difficult       07/24/2020    9:55 AM  Depression screen PHQ 2/9  Decreased Interest 0  Down, Depressed, Hopeless 0  PHQ - 2 Score 0  Altered sleeping 0  Tired, decreased energy 0  Change in appetite 0  Feeling bad or failure about yourself  0  Trouble concentrating 0  Moving slowly or fidgety/restless 0  Suicidal thoughts 0  PHQ-9 Score 0    BP Readings from Last 3 Encounters:  07/24/20 128/86  07/28/19 112/68  10/03/17 110/68    Physical Exam Vitals and nursing note reviewed.  Constitutional:      Appearance: Normal appearance. He is well-developed.  HENT:     Head: Normocephalic.     Right Ear: Tympanic membrane, ear canal and external ear normal.     Left Ear: Tympanic membrane, ear canal and external ear normal.     Nose: Nose normal.  Eyes:     Conjunctiva/sclera: Conjunctivae normal.     Pupils: Pupils are equal, round, and reactive to light.  Neck:     Thyroid: No thyromegaly.     Vascular: No carotid bruit.  Cardiovascular:     Rate and Rhythm: Normal rate and regular rhythm.     Heart sounds: Normal heart sounds.  Pulmonary:     Effort: Pulmonary effort is normal.     Breath sounds: Normal breath sounds. No wheezing.  Chest:  Breasts:    Right: No mass.     Left: No mass.  Abdominal:     General: Bowel sounds are normal.     Palpations: Abdomen is soft.     Tenderness:  There is no abdominal tenderness.  Musculoskeletal:        General: Normal range of motion.     Cervical back: Normal range of motion and neck supple.  Lymphadenopathy:     Cervical: No cervical adenopathy.  Skin:    General: Skin is warm and dry.  Neurological:     Mental Status: He is alert and oriented to person, place, and time.     Deep Tendon Reflexes: Reflexes are normal and symmetric.  Psychiatric:        Attention and Perception: Attention normal.        Mood and Affect: Mood normal.        Thought Content: Thought content normal.  Wt Readings from Last 3 Encounters:  07/24/20 146 lb (66.2 kg)  07/28/19 151 lb (68.5 kg)  06/30/18 145 lb (65.8 kg)    There were no vitals taken for this visit.  Assessment and Plan:

## 2021-07-27 ENCOUNTER — Other Ambulatory Visit: Payer: Self-pay | Admitting: Internal Medicine

## 2021-07-27 ENCOUNTER — Ambulatory Visit: Payer: Self-pay | Admitting: *Deleted

## 2021-07-27 ENCOUNTER — Other Ambulatory Visit: Payer: Self-pay

## 2021-07-27 DIAGNOSIS — G43009 Migraine without aura, not intractable, without status migrainosus: Secondary | ICD-10-CM

## 2021-07-27 MED ORDER — SUMATRIPTAN SUCCINATE 50 MG PO TABS: 50.0000 mg | ORAL_TABLET | ORAL | 0 refills | Status: AC | PRN

## 2021-07-27 NOTE — Telephone Encounter (Signed)
?  Chief Complaint: Pt returning a call.   He is willing to try the sumatriptan since his insurance will not cover the Relpax.  Please send to [facility].  [address]  [place], [place]   [phone]. ?Symptoms:  ?Frequency:  ?Pertinent Negatives: Patient denies  ?Disposition: [] ED /[] Urgent Care (no appt availability in office) / [] Appointment(In office/virtual)/ []  [facility]/ [] Home Care/ [] Refused Recommended Disposition /[] [facility]/ [x]  Follow-up with PCP ?Additional Notes: Message sent to [facility] for Dr.  ?

## 2021-07-27 NOTE — Telephone Encounter (Signed)
Requested medication (s) are due for refill today:   Yes ? ?Requested medication (s) are on the active medication list:   Yes ? ?Future visit scheduled:   No  Per note 07/25/2021 pt is no longer a pt at [facility].   Requesting a short supply until appt with new provider 09/11/2021.  Also insur. Has denied coverage. ? ? ?Last ordered: 07/24/2020 #12, 5 refills.   ? ?  ? ?Requested Prescriptions  ?Pending Prescriptions Disp Refills  ? eletriptan (RELPAX) 40 MG tablet [Pharmacy Med Name: ELETRIPTAN 40MG  TABLETS] 12 tablet 5  ?  Sig: TAKE 1 TABLET BY MOUTH AS NEEDED FOR MIGRAINE HEADACHE. MAY REPEAT IN 2 HRS IF HEADACHE PERSISTS OR RECURS  ?  ? Neurology:  Migraine Therapy - Triptan Failed - 07/27/2021 11:42 AM  ?  ?  Failed - Valid encounter within last 12 months  ?  Recent Outpatient Visits   ? ?      ? 1 year ago Annual physical exam  ? [facility], MD  ? 2 years ago Generalized anxiety disorder  ? [facility], MD  ? 3 years ago Gastroesophageal reflux disease, esophagitis presence not specified  ? [facility], MD  ? 3 years ago Gastroesophageal reflux disease, esophagitis presence not specified  ? [facility], MD  ? 5 years ago Migraine without aura and without status migrainosus, not intractable  ? [facility], MD  ? ?  ?  ? ? ?  ?  ?  Passed - Last BP in normal range  ?  BP Readings from Last 1 Encounters:  ?07/24/20 128/86  ?  ?  ?  ?  ? ?

## 2021-07-27 NOTE — Telephone Encounter (Signed)
Reason for Disposition ? [1] Caller has URGENT medicine question about med that PCP or specialist prescribed AND [2] triager unable to answer question ? ?Answer Assessment - Initial Assessment Questions ?1. NAME of MEDICATION: "What medicine are you calling about?" ?    Elatriptan  Relpax  ?2. QUESTION: "What is your question?" (e.g., double dose of medicine, side effect) ?    It wasn't approved by my insurance.   ? ?Pt is willing to try the Sumatriptan.   ? ? ?3. PRESCRIBING HCP: "Who prescribed it?" Reason: if prescribed by specialist, call should be referred to that group. ?    [facility] ?4. SYMPTOMS: "Do you have any symptoms?" ?    N/A ?5. SEVERITY: If symptoms are present, ask "Are they mild, moderate or severe?" ?     ?6. PREGNANCY:  "Is there any chance that you are pregnant?" "When was your last menstrual period?" ? ?Protocols used: Medication Question Call-A-AH ? ?

## 2021-07-27 NOTE — Telephone Encounter (Signed)
Pharmacy sent another request that pt's insurance would not cover this medication. Called pt again and LM to cal back .  ?

## 2021-07-27 NOTE — Telephone Encounter (Signed)
Sent in Sumatriptan. ?
